# Patient Record
Sex: Female | Born: 1980 | Race: Black or African American | Hispanic: No | Marital: Single | Smoking: Never smoker
Health system: Southern US, Community
[De-identification: ages and names within clinical notes are randomized; demographics above are authoritative.]

## PROBLEM LIST (undated history)

## (undated) DIAGNOSIS — C73 Malignant neoplasm of thyroid gland: Secondary | ICD-10-CM

## (undated) DIAGNOSIS — I10 Essential (primary) hypertension: Secondary | ICD-10-CM

## (undated) DIAGNOSIS — I499 Cardiac arrhythmia, unspecified: Secondary | ICD-10-CM

## (undated) DIAGNOSIS — F419 Anxiety disorder, unspecified: Secondary | ICD-10-CM

## (undated) DIAGNOSIS — J45909 Unspecified asthma, uncomplicated: Secondary | ICD-10-CM

## (undated) DIAGNOSIS — I447 Left bundle-branch block, unspecified: Secondary | ICD-10-CM

## (undated) HISTORY — PX: BREAST BIOPSY: SHX20

## (undated) HISTORY — PX: CHOLECYSTECTOMY: SHX55

---
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
## 2008-05-19 HISTORY — PX: CHOLECYSTECTOMY: SHX55

## 2014-02-16 ENCOUNTER — Inpatient Hospital Stay (HOSPITAL_COMMUNITY): Admission: AD | Admit: 2014-02-16 | Payer: Self-pay | Source: Ambulatory Visit | Admitting: Obstetrics & Gynecology

## 2015-09-03 DIAGNOSIS — Z136 Encounter for screening for cardiovascular disorders: Secondary | ICD-10-CM | POA: Diagnosis not present

## 2015-09-03 DIAGNOSIS — Z0000 Encounter for general adult medical examination without abnormal findings: Secondary | ICD-10-CM | POA: Diagnosis not present

## 2015-09-05 DIAGNOSIS — Z0000 Encounter for general adult medical examination without abnormal findings: Secondary | ICD-10-CM | POA: Diagnosis not present

## 2015-09-05 DIAGNOSIS — Z23 Encounter for immunization: Secondary | ICD-10-CM | POA: Diagnosis not present

## 2015-09-05 DIAGNOSIS — Z6824 Body mass index (BMI) 24.0-24.9, adult: Secondary | ICD-10-CM | POA: Diagnosis not present

## 2015-09-13 DIAGNOSIS — H5213 Myopia, bilateral: Secondary | ICD-10-CM | POA: Diagnosis not present

## 2015-09-13 DIAGNOSIS — H52222 Regular astigmatism, left eye: Secondary | ICD-10-CM | POA: Diagnosis not present

## 2015-11-05 DIAGNOSIS — Z23 Encounter for immunization: Secondary | ICD-10-CM | POA: Diagnosis not present

## 2016-02-27 DIAGNOSIS — K5900 Constipation, unspecified: Secondary | ICD-10-CM | POA: Diagnosis not present

## 2016-02-27 DIAGNOSIS — N390 Urinary tract infection, site not specified: Secondary | ICD-10-CM | POA: Diagnosis not present

## 2016-03-11 DIAGNOSIS — G44209 Tension-type headache, unspecified, not intractable: Secondary | ICD-10-CM | POA: Diagnosis not present

## 2016-03-11 DIAGNOSIS — R030 Elevated blood-pressure reading, without diagnosis of hypertension: Secondary | ICD-10-CM | POA: Diagnosis not present

## 2016-03-11 DIAGNOSIS — L989 Disorder of the skin and subcutaneous tissue, unspecified: Secondary | ICD-10-CM | POA: Diagnosis not present

## 2016-03-14 DIAGNOSIS — Z23 Encounter for immunization: Secondary | ICD-10-CM | POA: Diagnosis not present

## 2016-03-14 DIAGNOSIS — R51 Headache: Secondary | ICD-10-CM | POA: Diagnosis not present

## 2016-03-14 DIAGNOSIS — K5900 Constipation, unspecified: Secondary | ICD-10-CM | POA: Diagnosis not present

## 2016-03-14 DIAGNOSIS — Z6824 Body mass index (BMI) 24.0-24.9, adult: Secondary | ICD-10-CM | POA: Diagnosis not present

## 2016-03-14 DIAGNOSIS — I10 Essential (primary) hypertension: Secondary | ICD-10-CM | POA: Diagnosis not present

## 2016-05-06 DIAGNOSIS — L723 Sebaceous cyst: Secondary | ICD-10-CM | POA: Diagnosis not present

## 2016-05-06 DIAGNOSIS — D239 Other benign neoplasm of skin, unspecified: Secondary | ICD-10-CM | POA: Diagnosis not present

## 2016-05-15 DIAGNOSIS — F411 Generalized anxiety disorder: Secondary | ICD-10-CM | POA: Diagnosis not present

## 2016-05-15 DIAGNOSIS — N760 Acute vaginitis: Secondary | ICD-10-CM | POA: Diagnosis not present

## 2016-05-15 DIAGNOSIS — I10 Essential (primary) hypertension: Secondary | ICD-10-CM | POA: Diagnosis not present

## 2016-05-15 DIAGNOSIS — Z6823 Body mass index (BMI) 23.0-23.9, adult: Secondary | ICD-10-CM | POA: Diagnosis not present

## 2016-07-22 ENCOUNTER — Emergency Department (HOSPITAL_COMMUNITY)
Admission: EM | Admit: 2016-07-22 | Discharge: 2016-07-22 | Disposition: A | Discharge status: Home / Self Care | Payer: BLUE CROSS/BLUE SHIELD | Attending: Emergency Medicine | Admitting: Emergency Medicine

## 2016-07-22 ENCOUNTER — Emergency Department (HOSPITAL_COMMUNITY): Payer: BLUE CROSS/BLUE SHIELD

## 2016-07-22 ENCOUNTER — Encounter (HOSPITAL_COMMUNITY): Payer: Self-pay

## 2016-07-22 DIAGNOSIS — I10 Essential (primary) hypertension: Secondary | ICD-10-CM | POA: Insufficient documentation

## 2016-07-22 DIAGNOSIS — I447 Left bundle-branch block, unspecified: Secondary | ICD-10-CM | POA: Insufficient documentation

## 2016-07-22 DIAGNOSIS — Z79899 Other long term (current) drug therapy: Secondary | ICD-10-CM | POA: Diagnosis not present

## 2016-07-22 DIAGNOSIS — R079 Chest pain, unspecified: Secondary | ICD-10-CM | POA: Diagnosis not present

## 2016-07-22 DIAGNOSIS — M546 Pain in thoracic spine: Secondary | ICD-10-CM | POA: Diagnosis not present

## 2016-07-22 HISTORY — DX: Essential (primary) hypertension: I10

## 2016-07-22 LAB — BASIC METABOLIC PANEL
Anion gap: 10 (ref 5–15)
BUN: 9 mg/dL (ref 6–20)
CHLORIDE: 99 mmol/L — AB (ref 101–111)
CO2: 25 mmol/L (ref 22–32)
CREATININE: 0.72 mg/dL (ref 0.44–1.00)
Calcium: 9.6 mg/dL (ref 8.9–10.3)
GFR calc Af Amer: >60 mL/min (ref >60)
GFR calc non Af Amer: >60 mL/min (ref >60)
Glucose, Bld: 125 mg/dL — ABNORMAL HIGH (ref 65–99)
Potassium: 3.6 mmol/L (ref 3.5–5.1)
SODIUM: 134 mmol/L — AB (ref 135–145)

## 2016-07-22 LAB — CBC
HCT: 39.3 % (ref 36.0–46.0)
Hemoglobin: 12.4 g/dL (ref 12.0–15.0)
MCH: 25.8 pg — AB (ref 26.0–34.0)
MCHC: 31.6 g/dL (ref 30.0–36.0)
MCV: 81.7 fL (ref 78.0–100.0)
PLATELETS: 274 10*3/uL (ref 150–400)
RBC: 4.81 MIL/uL (ref 3.87–5.11)
RDW: 14.2 % (ref 11.5–15.5)
WBC: 7.8 10*3/uL (ref 4.0–10.5)

## 2016-07-22 LAB — I-STAT TROPONIN, ED: Troponin i, poc: 0 ng/mL (ref 0.00–0.08)

## 2016-07-22 MED ORDER — METHOCARBAMOL 500 MG PO TABS: 500.0000 mg | ORAL_TABLET | Freq: Two times a day (BID) | ORAL | 0 refills | Status: DC

## 2016-07-22 NOTE — ED Triage Notes (Signed)
Per Pt, Pt started to have upper back pain that started this morning. Reports that she went to urgent care. They did an EKG and the patient reports it to be abnormal. Describes the pain as intermittent burning sensation.

## 2016-07-22 NOTE — ED Provider Notes (Signed)
[facility] DEPT Provider Note   CSN: [id number] Arrival date & time: 07/22/16  [id number]     History   Chief Complaint Chief Complaint  Patient presents with  . Back Pain    HPI Mindy [name] is a 36 y.o. female.  HPI Mindy [name] is a 36 y.o. female with history of hypertension, presents to emergency department complaining of upper back pain. Patient states her pain started this morning while at work. She denies any pain radiation. Denies any chest pain or shortness of breath. Denies any injuries. States pain was worse with movement, and now completely resolved. She did not take any medications for this. She does exercises vigorously, and lifts heavy objects at work, and states she thought she pulled a muscle. She went to urgent care, there they did an EKG which was found to be of normal and she was sent here for further evaluation. Patient is currently completely asymptomatic. She reports history of abnormal EKG which showed left bundle branch block 2 years ago after evaluation for shortness of breath postpartum. She states at that time she was evaluated in [place] by the cardiology group, had stress test and echocardiogram which both came back normal. She has not seen a cardiologist since then. She denies any pleuritic chest pain, no swelling in extremities, no recent travel, no surgeries, denies being pregnant.  Past Medical History:  Diagnosis Date  . Hypertension     There are no active problems to display for this patient.   Past Surgical History:  Procedure Laterality Date  . BREAST BIOPSY    . CHOLECYSTECTOMY      OB History    No data available       Home Medications    Prior to Admission medications   Not on File    Family History No family history on file.  Social History Social History  Substance Use Topics  . Smoking status: Never Smoker  . Smokeless tobacco: Never Used  . Alcohol use No     Allergies   Patient has no known  allergies.   Review of Systems Review of Systems  Constitutional: Negative for chills and fever.  Respiratory: Negative for cough, chest tightness and shortness of breath.   Cardiovascular: Negative for chest pain, palpitations and leg swelling.  Gastrointestinal: Negative for abdominal pain, diarrhea, nausea and vomiting.  Genitourinary: Negative for dysuria, flank pain and pelvic pain.  Musculoskeletal: Positive for back pain. Negative for arthralgias, myalgias, neck pain and neck stiffness.  Skin: Negative for rash.  Neurological: Negative for dizziness, weakness, numbness and headaches.  All other systems reviewed and are negative.    Physical Exam Updated Vital Signs BP 136/100   Pulse 78   Temp 98.5 F (36.9 C) (Oral)   Resp 16   Ht 5\' 7"  (1.702 m)   Wt 68 kg   LMP 07/15/2016   SpO2 100%   BMI 23.49 kg/m   Physical Exam  Constitutional: She is oriented to person, place, and time. She appears well-developed and well-nourished. No distress.  HENT:  Head: Normocephalic.  Eyes: Conjunctivae are normal.  Neck: Neck supple.  Cardiovascular: Normal rate, regular rhythm and normal heart sounds.   Pulmonary/Chest: Effort normal and breath sounds normal. No respiratory distress. She has no wheezes. She has no rales.  Abdominal: Soft. Bowel sounds are normal. She exhibits no distension. There is no tenderness. There is no rebound.  Musculoskeletal: She exhibits no edema.  No midline thoracic spine tenderness, no perivertebral tenderenss. Full  ROM of all extremities.   Neurological: She is alert and oriented to person, place, and time.  Skin: Skin is warm and dry.  Psychiatric: She has a normal mood and affect. Her behavior is normal.  Nursing note and vitals reviewed.    ED Treatments / Results  Labs (all labs ordered are listed, but only abnormal results are displayed) Labs Reviewed  BASIC METABOLIC PANEL - Abnormal; Notable for the following:       Result Value    Sodium 134 (*)    Chloride 99 (*)    Glucose, Bld 125 (*)    All other components within normal limits  CBC - Abnormal; Notable for the following:    MCH 25.8 (*)    All other components within normal limits  I-STAT TROPOININ, ED    EKG  EKG Interpretation  Date/Time:  Tuesday July 22 2016 18:59:54 EST Ventricular Rate:  99 PR Interval:  176 QRS Duration: 140 QT Interval:  366 QTC Calculation: 469 R Axis:   50 Text Interpretation:  Normal sinus rhythm Biatrial enlargement Left bundle branch block Abnormal ECG Confirmed by Alvino Chapel  MD, Ovid Curd [phone]) on 07/22/2016 8:48:56 PM       Radiology Dg Chest 2 View  Result Date: 07/22/2016 CLINICAL DATA:  Back pain, chest pain, EKG changes EXAM: CHEST  2 VIEW COMPARISON:  None. FINDINGS: The heart size and mediastinal contours are within normal limits. Both lungs are clear. The visualized skeletal structures are unremarkable. IMPRESSION: No active cardiopulmonary disease. Electronically Signed   By: Lahoma Crocker M.D.   On: 07/22/2016 19:53    Procedures Procedures (including critical care time)  Medications Ordered in ED Medications - No data to display   Initial Impression / Assessment and Plan / ED Course  I have reviewed the triage vital signs and the nursing notes.  Pertinent labs & imaging results that were available during my care of the patient were reviewed by me and considered in my medical decision making (see chart for details).     Patient with some upper back pain earlier which is now resolved, sent here for abnormal ECG. Patient's EKG showing left bundle branch block. Patient has history of the same, although we do not have records, patient is confident that she has history of left bundle branch block and had negative stress test and echo 2 years ago. Today her troponin is negative. Chest x-ray is negative. She has no other risk factors for cardiac disease. She is PERC negative.  We'll discharge her home. Patient  requested cardiology referral so she can follow-up here. We'll give [facility] phone for [facility]. Return precautions discussed.   Vitals:   07/22/16 1858 07/22/16 2115 07/22/16 2130 07/22/16 2145  BP:  140/89 144/92 136/100  Pulse:  89 75 78  Resp:  15 13 16   Temp:      TempSrc:      SpO2:  100% 100% 100%  Weight: 68 kg     Height: 5\' 7"  (1.702 m)        Final Clinical Impressions(s) / ED Diagnoses   Final diagnoses:  Left bundle branch block    New Prescriptions New Prescriptions   No medications on file     Jeannett Senior, PA-C 07/23/16 [place], MD 07/28/16 [phone]

## 2016-07-22 NOTE — Discharge Instructions (Signed)
Take ibuprofen or aleve for pain as needed. Robaxin for muscle spasms. Please follow up with a cardiologist regarding your ECG. Return if any concerning symptoms.

## 2016-09-03 DIAGNOSIS — Z136 Encounter for screening for cardiovascular disorders: Secondary | ICD-10-CM | POA: Diagnosis not present

## 2016-09-03 DIAGNOSIS — Z0000 Encounter for general adult medical examination without abnormal findings: Secondary | ICD-10-CM | POA: Diagnosis not present

## 2016-09-05 DIAGNOSIS — Z6824 Body mass index (BMI) 24.0-24.9, adult: Secondary | ICD-10-CM | POA: Diagnosis not present

## 2016-09-05 DIAGNOSIS — Z0000 Encounter for general adult medical examination without abnormal findings: Secondary | ICD-10-CM | POA: Diagnosis not present

## 2016-11-01 DIAGNOSIS — H5213 Myopia, bilateral: Secondary | ICD-10-CM | POA: Diagnosis not present

## 2017-03-27 DIAGNOSIS — N390 Urinary tract infection, site not specified: Secondary | ICD-10-CM | POA: Diagnosis not present

## 2017-03-27 DIAGNOSIS — F411 Generalized anxiety disorder: Secondary | ICD-10-CM | POA: Diagnosis not present

## 2017-03-27 DIAGNOSIS — Z6824 Body mass index (BMI) 24.0-24.9, adult: Secondary | ICD-10-CM | POA: Diagnosis not present

## 2017-03-27 DIAGNOSIS — I10 Essential (primary) hypertension: Secondary | ICD-10-CM | POA: Diagnosis not present

## 2017-09-09 DIAGNOSIS — Z0000 Encounter for general adult medical examination without abnormal findings: Secondary | ICD-10-CM | POA: Diagnosis not present

## 2017-09-09 DIAGNOSIS — Z1329 Encounter for screening for other suspected endocrine disorder: Secondary | ICD-10-CM | POA: Diagnosis not present

## 2017-09-09 DIAGNOSIS — Z114 Encounter for screening for human immunodeficiency virus [HIV]: Secondary | ICD-10-CM | POA: Diagnosis not present

## 2017-09-09 DIAGNOSIS — Z13220 Encounter for screening for lipoid disorders: Secondary | ICD-10-CM | POA: Diagnosis not present

## 2017-09-11 DIAGNOSIS — Z6824 Body mass index (BMI) 24.0-24.9, adult: Secondary | ICD-10-CM | POA: Diagnosis not present

## 2017-09-11 DIAGNOSIS — N3090 Cystitis, unspecified without hematuria: Secondary | ICD-10-CM | POA: Diagnosis not present

## 2017-09-11 DIAGNOSIS — Z0000 Encounter for general adult medical examination without abnormal findings: Secondary | ICD-10-CM | POA: Diagnosis not present

## 2017-09-11 DIAGNOSIS — N390 Urinary tract infection, site not specified: Secondary | ICD-10-CM | POA: Diagnosis not present

## 2017-10-13 DIAGNOSIS — H9313 Tinnitus, bilateral: Secondary | ICD-10-CM | POA: Diagnosis not present

## 2017-10-13 DIAGNOSIS — I10 Essential (primary) hypertension: Secondary | ICD-10-CM | POA: Diagnosis not present

## 2017-10-13 DIAGNOSIS — Z6825 Body mass index (BMI) 25.0-25.9, adult: Secondary | ICD-10-CM | POA: Diagnosis not present

## 2017-10-13 DIAGNOSIS — H6123 Impacted cerumen, bilateral: Secondary | ICD-10-CM | POA: Diagnosis not present

## 2017-11-02 DIAGNOSIS — Z0110 Encounter for examination of ears and hearing without abnormal findings: Secondary | ICD-10-CM | POA: Diagnosis not present

## 2017-11-02 DIAGNOSIS — H9311 Tinnitus, right ear: Secondary | ICD-10-CM | POA: Diagnosis not present

## 2018-05-20 DIAGNOSIS — I10 Essential (primary) hypertension: Secondary | ICD-10-CM | POA: Diagnosis not present

## 2018-05-20 DIAGNOSIS — Z6823 Body mass index (BMI) 23.0-23.9, adult: Secondary | ICD-10-CM | POA: Diagnosis not present

## 2018-06-21 DIAGNOSIS — M542 Cervicalgia: Secondary | ICD-10-CM | POA: Diagnosis not present

## 2018-06-21 DIAGNOSIS — R252 Cramp and spasm: Secondary | ICD-10-CM | POA: Diagnosis not present

## 2018-06-21 DIAGNOSIS — M549 Dorsalgia, unspecified: Secondary | ICD-10-CM | POA: Diagnosis not present

## 2018-06-21 DIAGNOSIS — Z6824 Body mass index (BMI) 24.0-24.9, adult: Secondary | ICD-10-CM | POA: Diagnosis not present

## 2018-07-05 DIAGNOSIS — M542 Cervicalgia: Secondary | ICD-10-CM | POA: Diagnosis not present

## 2018-07-05 DIAGNOSIS — Z6824 Body mass index (BMI) 24.0-24.9, adult: Secondary | ICD-10-CM | POA: Diagnosis not present

## 2018-07-05 DIAGNOSIS — M549 Dorsalgia, unspecified: Secondary | ICD-10-CM | POA: Diagnosis not present

## 2018-07-05 DIAGNOSIS — I10 Essential (primary) hypertension: Secondary | ICD-10-CM | POA: Diagnosis not present

## 2018-11-24 DIAGNOSIS — Z135 Encounter for screening for eye and ear disorders: Secondary | ICD-10-CM | POA: Diagnosis not present

## 2018-11-24 DIAGNOSIS — H5210 Myopia, unspecified eye: Secondary | ICD-10-CM | POA: Diagnosis not present

## 2018-12-17 DIAGNOSIS — H938X3 Other specified disorders of ear, bilateral: Secondary | ICD-10-CM | POA: Diagnosis not present

## 2018-12-20 DIAGNOSIS — Z0000 Encounter for general adult medical examination without abnormal findings: Secondary | ICD-10-CM | POA: Diagnosis not present

## 2019-01-25 DIAGNOSIS — Z0000 Encounter for general adult medical examination without abnormal findings: Secondary | ICD-10-CM | POA: Diagnosis not present

## 2019-01-25 DIAGNOSIS — Z124 Encounter for screening for malignant neoplasm of cervix: Secondary | ICD-10-CM | POA: Diagnosis not present

## 2019-01-25 DIAGNOSIS — Z6825 Body mass index (BMI) 25.0-25.9, adult: Secondary | ICD-10-CM | POA: Diagnosis not present

## 2019-10-31 ENCOUNTER — Emergency Department (HOSPITAL_COMMUNITY): Payer: 59

## 2019-10-31 ENCOUNTER — Encounter (HOSPITAL_COMMUNITY): Payer: Self-pay | Admitting: Emergency Medicine

## 2019-10-31 ENCOUNTER — Emergency Department (HOSPITAL_COMMUNITY)
Admission: EM | Admit: 2019-10-31 | Discharge: 2019-10-31 | Disposition: A | Discharge status: Home / Self Care | Payer: 59 | Attending: Emergency Medicine | Admitting: Emergency Medicine

## 2019-10-31 ENCOUNTER — Other Ambulatory Visit: Payer: Self-pay

## 2019-10-31 DIAGNOSIS — I10 Essential (primary) hypertension: Secondary | ICD-10-CM | POA: Insufficient documentation

## 2019-10-31 DIAGNOSIS — R079 Chest pain, unspecified: Secondary | ICD-10-CM

## 2019-10-31 DIAGNOSIS — R0789 Other chest pain: Secondary | ICD-10-CM | POA: Insufficient documentation

## 2019-10-31 LAB — CBC
HCT: 40.9 % (ref 36.0–46.0)
Hemoglobin: 12.9 g/dL (ref 12.0–15.0)
MCH: 27.2 pg (ref 26.0–34.0)
MCHC: 31.5 g/dL (ref 30.0–36.0)
MCV: 86.3 fL (ref 80.0–100.0)
Platelets: 244 10*3/uL (ref 150–400)
RBC: 4.74 MIL/uL (ref 3.87–5.11)
RDW: 12.8 % (ref 11.5–15.5)
WBC: 6.2 10*3/uL (ref 4.0–10.5)
nRBC: 0 % (ref 0.0–0.2)

## 2019-10-31 LAB — BASIC METABOLIC PANEL
Anion gap: 9 (ref 5–15)
BUN: 8 mg/dL (ref 6–20)
CO2: 26 mmol/L (ref 22–32)
Calcium: 9.6 mg/dL (ref 8.9–10.3)
Chloride: 103 mmol/L (ref 98–111)
Creatinine, Ser: 0.62 mg/dL (ref 0.44–1.00)
GFR calc Af Amer: >60 mL/min (ref >60)
GFR calc non Af Amer: >60 mL/min (ref >60)
Glucose, Bld: 105 mg/dL — ABNORMAL HIGH (ref 70–99)
Potassium: 3.2 mmol/L — ABNORMAL LOW (ref 3.5–5.1)
Sodium: 138 mmol/L (ref 135–145)

## 2019-10-31 LAB — TROPONIN I (HIGH SENSITIVITY)
Troponin I (High Sensitivity): <2 ng/L (ref <18)
Troponin I (High Sensitivity): <2 ng/L (ref <18)

## 2019-10-31 LAB — I-STAT BETA HCG BLOOD, ED (MC, WL, AP ONLY): I-stat hCG, quantitative: <5 m[IU]/mL (ref <5)

## 2019-10-31 MED ORDER — SODIUM CHLORIDE 0.9% FLUSH: 3.0000 mL | Freq: Once | INTRAVENOUS | Status: DC

## 2019-10-31 NOTE — ED Provider Notes (Signed)
[facility] EMERGENCY DEPARTMENT Provider Note   CSN: [id number] Arrival date & time: 10/31/19  0703     History Chief Complaint  Patient presents with  . Chest Pain    Mindy [name] is a 39 y.o. female.  39 year old female presents with intermittent chest discomfort characterizes pressure was last for seconds to minutes.  States that she has been under a lot of stress and has had some anxiety recently.  States the pain comes and goes and nothing makes it better or worse.  No associated dyspnea or diaphoresis.  No prior history of same.  Denies any fever or cough.  No leg pain or swelling.  Symptoms resolved spontaneously.  No treatment use prior to arrival        Past Medical History:  Diagnosis Date  . Hypertension     There are no problems to display for this patient.   Past Surgical History:  Procedure Laterality Date  . BREAST BIOPSY    . CHOLECYSTECTOMY       OB History   No obstetric history on file.     No family history on file.  Social History   Tobacco Use  . Smoking status: Never Smoker  . Smokeless tobacco: Never Used  Substance Use Topics  . Alcohol use: No  . Drug use: No    Home Medications Prior to Admission medications   Medication Sig Start Date End Date Taking? Authorizing Provider  methocarbamol (ROBAXIN) 500 MG tablet Take 1 tablet (500 mg total) by mouth 2 (two) times daily. 07/22/16   Jeannett Senior, PA-C    Allergies    Patient has no known allergies.  Review of Systems   Review of Systems  All other systems reviewed and are negative.   Physical Exam Updated Vital Signs BP (!) 148/96 (BP Location: Right Arm)   Pulse (!) 122   Temp 98.3 F (36.8 C) (Oral)   Resp 20   Ht 1.702 m (5\' 7" )   Wt 65.8 kg   LMP 10/10/2019   SpO2 100%   BMI 22.71 kg/m   Physical Exam Vitals and nursing note reviewed.  Constitutional:      General: She is not in acute distress.    Appearance: Normal  appearance. She is well-developed. She is not toxic-appearing.  HENT:     Head: Normocephalic and atraumatic.  Eyes:     General: Lids are normal.     Conjunctiva/sclera: Conjunctivae normal.     Pupils: Pupils are equal, round, and reactive to light.  Neck:     Thyroid: No thyroid mass.     Trachea: No tracheal deviation.  Cardiovascular:     Rate and Rhythm: Normal rate and regular rhythm.     Heart sounds: Normal heart sounds. No murmur heard.  No gallop.   Pulmonary:     Effort: Pulmonary effort is normal. No respiratory distress.     Breath sounds: Normal breath sounds. No stridor. No decreased breath sounds, wheezing, rhonchi or rales.  Abdominal:     General: Bowel sounds are normal. There is no distension.     Palpations: Abdomen is soft.     Tenderness: There is no abdominal tenderness. There is no rebound.  Musculoskeletal:        General: No tenderness. Normal range of motion.     Cervical back: Normal range of motion and neck supple.  Skin:    General: Skin is warm and dry.     Findings: No  abrasion or rash.  Neurological:     Mental Status: She is alert and oriented to person, place, and time.     GCS: GCS eye subscore is 4. GCS verbal subscore is 5. GCS motor subscore is 6.     Cranial Nerves: No cranial nerve deficit.     Sensory: No sensory deficit.  Psychiatric:        Attention and Perception: Attention normal.        Speech: Speech normal.        Behavior: Behavior normal.     ED Results / Procedures / Treatments   Labs (all labs ordered are listed, but only abnormal results are displayed) Labs Reviewed  BASIC METABOLIC PANEL - Abnormal; Notable for the following components:      Result Value   Potassium 3.2 (*)    Glucose, Bld 105 (*)    All other components within normal limits  CBC  I-STAT BETA HCG BLOOD, ED ([facility], [facility], [facility] ONLY)  TROPONIN I (HIGH SENSITIVITY)  TROPONIN I (HIGH SENSITIVITY)    EKG EKG Interpretation  Date/Time:  Monday October 31 2019 07:03:55 EDT Ventricular Rate:  96 PR Interval:  190 QRS Duration: 130 QT Interval:  372 QTC Calculation: 469 R Axis:   83 Text Interpretation: Normal sinus rhythm Right atrial enlargement Left ventricular hypertrophy with QRS widening and repolarization abnormality ( Cornell product , Romhilt-Estes ) Cannot rule out Anteroseptal infarct , age undetermined Abnormal ECG No significant change since last tracing Confirmed by Lacretia Leigh ([id number]) on 10/31/2019 9:25:36 AM   Radiology DG Chest 2 View  Result Date: 10/31/2019 CLINICAL DATA:  Left-sided chest pain.  Cold sweats. EXAM: CHEST - 2 VIEW COMPARISON:  07/22/2016 FINDINGS: Normal heart size and mediastinal contours. Artifact from EKG leads. No acute infiltrate or edema. No effusion or pneumothorax. No acute osseous findings. Presumed cholecystectomy clips. IMPRESSION: Negative chest. Electronically Signed   By: Monte Fantasia M.D.   On: 10/31/2019 07:39    Procedures Procedures (including critical care time)  Medications Ordered in ED Medications  sodium chloride flush (NS) 0.9 % injection 3 mL (has no administration in time range)    ED Course  I have reviewed the triage vital signs and the nursing notes.  Pertinent labs & imaging results that were available during my care of the patient were reviewed by me and considered in my medical decision making (see chart for details).    MDM Rules/Calculators/A&P                         Patient with atypical chest pain here.  Dr. Troponin is normal.  Heart score is 1.  No further evaluation needed and suspect anxiety has some component of her symptoms.  Patient encouraged to follow-up with her doctor Final Clinical Impression(s) / ED Diagnoses Final diagnoses:  None    Rx / DC Orders ED Discharge Orders    None       Lacretia Leigh, MD 10/31/19 1150

## 2019-10-31 NOTE — ED Triage Notes (Signed)
Pt reports she woke up with L sided chest pain, nausea and cold sweats over night, states she was able to get back to sleep, upon waking this morning, pain had returned, denies sob. Reports hx of LBBB. Resp e/u, nad.

## 2019-10-31 NOTE — ED Notes (Signed)
Pt ambulated to br with a steady gait.

## 2020-01-27 ENCOUNTER — Other Ambulatory Visit: Payer: Self-pay | Admitting: Family Medicine

## 2020-01-27 DIAGNOSIS — E041 Nontoxic single thyroid nodule: Secondary | ICD-10-CM

## 2020-02-01 ENCOUNTER — Ambulatory Visit
Admission: RE | Admit: 2020-02-01 | Discharge: 2020-02-01 | Disposition: A | Discharge status: Home / Self Care | Payer: 59 | Source: Ambulatory Visit | Attending: Family Medicine | Admitting: Family Medicine

## 2020-02-01 DIAGNOSIS — E041 Nontoxic single thyroid nodule: Secondary | ICD-10-CM

## 2020-02-02 ENCOUNTER — Other Ambulatory Visit: Payer: Self-pay | Admitting: Family Medicine

## 2020-02-02 DIAGNOSIS — E041 Nontoxic single thyroid nodule: Secondary | ICD-10-CM

## 2020-02-09 ENCOUNTER — Ambulatory Visit
Admission: RE | Admit: 2020-02-09 | Discharge: 2020-02-09 | Disposition: A | Discharge status: Home / Self Care | Payer: 59 | Source: Ambulatory Visit | Attending: Family Medicine | Admitting: Family Medicine

## 2020-02-09 ENCOUNTER — Other Ambulatory Visit (HOSPITAL_COMMUNITY)
Admission: RE | Admit: 2020-02-09 | Discharge: 2020-02-09 | Disposition: A | Discharge status: Home / Self Care | Payer: 59 | Source: Ambulatory Visit | Attending: Student | Admitting: Student

## 2020-02-09 DIAGNOSIS — E041 Nontoxic single thyroid nodule: Secondary | ICD-10-CM | POA: Diagnosis present

## 2020-02-09 DIAGNOSIS — D440 Neoplasm of uncertain behavior of thyroid gland: Secondary | ICD-10-CM | POA: Insufficient documentation

## 2020-02-10 LAB — CYTOLOGY - NON PAP

## 2020-03-01 ENCOUNTER — Ambulatory Visit: Payer: Self-pay | Admitting: Surgery

## 2020-03-06 ENCOUNTER — Encounter (HOSPITAL_COMMUNITY): Payer: Self-pay

## 2020-03-06 NOTE — Progress Notes (Addendum)
COVID Vaccine Completed:  x2 Date COVID Vaccine completed:  August 2021 2nd dose COVID vaccine manufacturer: [employer]   PCP - Rachell Cipro, MD Cardiologist -   Chest x-ray - 03-07-20 EKG - 10-31-19 in Epic Stress Test - 4+ years at [facility] Cardiology ECHO - 4+ years at [facility] Cardiology  Cardiac Cath -  Pacemaker/ICD device last checked:  Sleep Study -  CPAP -   Fasting Blood Sugar -  Checks Blood Sugar _____ times a day  Blood Thinner Instructions: Aspirin Instructions: Last Dose:  Anesthesia review:  Evaluation for chest pain 10-31-19 (attributed to anxiety).  Hx of abnormal EKG &  LBBB  Patient denies shortness of breath, fever, cough and chest pain at PAT appointment   Patient verbalized understanding of instructions that were given to them at the PAT appointment. Patient was also instructed that they will need to review over the PAT instructions again at home before surgery.

## 2020-03-06 NOTE — Patient Instructions (Addendum)
DUE TO COVID-19 ONLY ONE VISITOR IS ALLOWED TO COME WITH YOU AND STAY IN THE WAITING ROOM ONLY DURING PRE OP AND  PROCEDURE.   IF YOU WILL BE ADMITTED INTO THE HOSPITAL YOU ARE ALLOWED ONE SUPPORT PERSON DURING VISITATION HOURS ONLY (10AM -8PM)   . The support person may change daily. . The support person must pass our screening, gel in and out, and wear a mask at all times, including in the patient's room. . Patients must also wear a mask when staff or their support person are in the room.   COVID SWAB TESTING MUST BE COMPLETED ON:  Thursday, 03-15-20 @ 9:00 AM   [address] [place], [place] [address]  (Must self quarantine after testing. Follow instructions on handout.)        Your procedure is scheduled on:  Monday, 03-19-20   Report to [facility] Main  Entrance   Report to Short Stay at 5:30 AM   [facility])    Call this number if you have problems the morning of surgery [phone]   Do not eat food :After Midnight.   May have liquids until 4:30 AM day of surgery  CLEAR LIQUID DIET  Foods Allowed                                                                     Foods Excluded  Water, Black Coffee and tea, regular and decaf               liquids that you cannot  Plain Jell-O in any flavor  (No red)                                      see through such as: Fruit ices (not with fruit pulp)                                      milk, soups, orange juice              Iced Popsicles (No red)                                      All solid food                                   Apple juices Sports drinks like Gatorade (No red) Lightly seasoned clear broth or consume(fat free) Sugar, honey syrup  Oral Hygiene is also important to reduce your risk of infection.                                    Remember - BRUSH YOUR TEETH THE MORNING OF SURGERY WITH YOUR REGULAR TOOTHPASTE   Do NOT smoke after Midnight   Take these medicines the morning of surgery with A SIP OF  WATER:  None  You may not have any metal on your body including hair pins, jewelry, and body piercings             Do not wear make-up, lotions, powders, perfumes/cologne, or deodorant             Do not wear nail polish.  Do not shave  48 hours prior to surgery.        Do not bring valuables to the hospital. [place].   Contacts, dentures or bridgework may not be worn into surgery.   Bring small overnight bag day of surgery.                  Please read over the following fact sheets you were given: IF YOU HAVE QUESTIONS ABOUT YOUR PRE OP INSTRUCTIONS PLEASE CALL [phone]   [facility] - Preparing for Surgery Before surgery, you can play an important role.  Because skin is not sterile, your skin needs to be as free of germs as possible.  You can reduce the number of germs on your skin by washing with CHG (chlorahexidine gluconate) soap before surgery.  CHG is an antiseptic cleaner which kills germs and bonds with the skin to continue killing germs even after washing. Please DO NOT use if you have an allergy to CHG or antibacterial soaps.  If your skin becomes reddened/irritated stop using the CHG and inform your nurse when you arrive at Short Stay. Do not shave (including legs and underarms) for at least 48 hours prior to the first CHG shower.  You may shave your face/neck.  Please follow these instructions carefully:  1.  Shower with CHG Soap the night before surgery and the  morning of surgery.  2.  If you choose to wash your hair, wash your hair first as usual with your normal  shampoo.  3.  After you shampoo, rinse your hair and body thoroughly to remove the shampoo.                             4.  Use CHG as you would any other liquid soap.  You can apply chg directly to the skin and wash.  Gently with a scrungie or clean washcloth.  5.  Apply the CHG Soap to your body ONLY FROM THE NECK DOWN.   Do   not use  on face/ open                           Wound or open sores. Avoid contact with eyes, ears mouth and   genitals (private parts).                       Wash face,  Genitals (private parts) with your normal soap.             6.  Wash thoroughly, paying special attention to the area where your    surgery  will be performed.  7.  Thoroughly rinse your body with warm water from the neck down.  8.  DO NOT shower/wash with your normal soap after using and rinsing off the CHG Soap.                9.  Pat yourself dry with a clean towel.            10.  Wear  clean pajamas.            11.  Place clean sheets on your bed the night of your first shower and do not  sleep with pets. Day of Surgery : Do not apply any lotions/deodorants the morning of surgery.  Please wear clean clothes to the hospital/surgery center.  FAILURE TO FOLLOW THESE INSTRUCTIONS MAY RESULT IN THE CANCELLATION OF YOUR SURGERY  PATIENT SIGNATURE_________________________________  NURSE SIGNATURE__________________________________  ________________________________________________________________________

## 2020-03-07 ENCOUNTER — Encounter (HOSPITAL_COMMUNITY): Payer: Self-pay

## 2020-03-07 ENCOUNTER — Ambulatory Visit (HOSPITAL_COMMUNITY)
Admission: RE | Admit: 2020-03-07 | Discharge: 2020-03-07 | Disposition: A | Discharge status: Home / Self Care | Payer: 59 | Source: Ambulatory Visit | Attending: Anesthesiology | Admitting: Anesthesiology

## 2020-03-07 ENCOUNTER — Encounter (HOSPITAL_COMMUNITY)
Admission: RE | Admit: 2020-03-07 | Discharge: 2020-03-07 | Disposition: A | Discharge status: Home / Self Care | Payer: 59 | Source: Ambulatory Visit | Attending: Surgery | Admitting: Surgery

## 2020-03-07 ENCOUNTER — Other Ambulatory Visit: Payer: Self-pay

## 2020-03-07 DIAGNOSIS — Z01818 Encounter for other preprocedural examination: Secondary | ICD-10-CM | POA: Insufficient documentation

## 2020-03-07 HISTORY — DX: Left bundle-branch block, unspecified: I44.7

## 2020-03-07 HISTORY — DX: Anxiety disorder, unspecified: F41.9

## 2020-03-07 HISTORY — DX: Malignant neoplasm of thyroid gland: C73

## 2020-03-07 LAB — BASIC METABOLIC PANEL
Anion gap: 9 (ref 5–15)
BUN: 9 mg/dL (ref 6–20)
CO2: 28 mmol/L (ref 22–32)
Calcium: 9.4 mg/dL (ref 8.9–10.3)
Chloride: 99 mmol/L (ref 98–111)
Creatinine, Ser: 0.61 mg/dL (ref 0.44–1.00)
GFR, Estimated: >60 mL/min (ref >60)
Glucose, Bld: 96 mg/dL (ref 70–99)
Potassium: 3.9 mmol/L (ref 3.5–5.1)
Sodium: 136 mmol/L (ref 135–145)

## 2020-03-07 LAB — CBC
HCT: 39.6 % (ref 36.0–46.0)
Hemoglobin: 12.5 g/dL (ref 12.0–15.0)
MCH: 27.2 pg (ref 26.0–34.0)
MCHC: 31.6 g/dL (ref 30.0–36.0)
MCV: 86.1 fL (ref 80.0–100.0)
Platelets: 228 10*3/uL (ref 150–400)
RBC: 4.6 MIL/uL (ref 3.87–5.11)
RDW: 12.8 % (ref 11.5–15.5)
WBC: 5.4 10*3/uL (ref 4.0–10.5)
nRBC: 0 % (ref 0.0–0.2)

## 2020-03-11 ENCOUNTER — Encounter (HOSPITAL_COMMUNITY): Payer: Self-pay | Admitting: Surgery

## 2020-03-11 DIAGNOSIS — C73 Malignant neoplasm of thyroid gland: Secondary | ICD-10-CM | POA: Diagnosis present

## 2020-03-11 NOTE — H&P (Signed)
[facility]  Mindy [name] DOB: 04/09/1981 Single / Language: Cleophus Molt / Race: Black or African American Female   History of Present Illness  The patient is a 39 year old female who presents with thyroid cancer.  CHIEF COMPLAINT: papillary thyroid carcinoma  Patient is referred by Dr. Rachell Cipro for surgical evaluation and management of newly diagnosed papillary thyroid carcinoma. Patient will see Dr. Dagmar Hait in consultation from endocrinology. Patient noted a new mass in the right anterior neck approximately one month ago on self-examination. She presented to her primary care physician and was referred for ultrasound. This study showed a mildly enlarged thyroid gland with a solitary dominant nodule in the right superior position measuring 3.4 cm with echogenic foci and peripheral calcifications. It was irregular. It was felt to be highly suspicious and fine-needle aspiration biopsy was recommended. Patient underwent biopsy on February 09, 2020. This demonstrated papillary thyroid carcinoma, Bethesda category VI. Patient was referred to surgery for thyroidectomy. Patient has no prior history of head or neck surgery. She has no prior history of thyroid disease. She has never been on thyroid medication. There are no immediate family members with thyroid disease. Patient has worked for an [employer] for 15 years. She has 2 daughters. She presents today to discuss management of her thyroid cancer.   Past Surgical History Breast Biopsy  Right. Gallbladder Surgery - Laparoscopic   Diagnostic Studies History  Colonoscopy  never Mammogram  never Pap Smear  1-5 years ago  Allergies  No Known Drug Allergies  Allergies Reconciled   Medication History  Lisinopril (5MG  Tablet, Oral) Active. Prenatal Tablets (Oral) Specific strength unknown - Active. Medications Reconciled  Pregnancy / Birth History  Age at menarche   89 years. Gravida  2 Length (months) of breastfeeding  3-6 Maternal age  85-30 Para  2 Regular periods   Other Problems  Anxiety Disorder  Asthma  Back Pain  Chest pain  Cholelithiasis  High blood pressure  Lump In Breast   Review of Systems  General Not Present- Appetite Loss, Chills, Fatigue, Fever, Night Sweats, Weight Gain and Weight Loss. Skin Not Present- Change in Wart/Mole, Dryness, Hives, Jaundice, New Lesions, Non-Healing Wounds, Rash and Ulcer. HEENT Present- Wears glasses/contact lenses. Not Present- Earache, Hearing Loss, Hoarseness, Nose Bleed, Oral Ulcers, Ringing in the Ears, Seasonal Allergies, Sinus Pain, Sore Throat, Visual Disturbances and Yellow Eyes. Respiratory Not Present- Bloody sputum, Chronic Cough, Difficulty Breathing, Snoring and Wheezing. Breast Not Present- Breast Mass, Breast Pain, Nipple Discharge and Skin Changes. Cardiovascular Not Present- Chest Pain, Difficulty Breathing Lying Down, Leg Cramps, Palpitations, Rapid Heart Rate, Shortness of Breath and Swelling of Extremities. Gastrointestinal Not Present- Abdominal Pain, Bloating, Bloody Stool, Change in Bowel Habits, Chronic diarrhea, Constipation, Difficulty Swallowing, Excessive gas, Gets full quickly at meals, Hemorrhoids, Indigestion, Nausea, Rectal Pain and Vomiting. Female Genitourinary Not Present- Frequency, Nocturia, Painful Urination, Pelvic Pain and Urgency. Musculoskeletal Not Present- Back Pain, Joint Pain, Joint Stiffness, Muscle Pain, Muscle Weakness and Swelling of Extremities. Neurological Not Present- Decreased Memory, Fainting, Headaches, Numbness, Seizures, Tingling, Tremor, Trouble walking and Weakness. Psychiatric Present- Anxiety. Not Present- Bipolar, Change in Sleep Pattern, Depression, Fearful and Frequent crying. Endocrine Not Present- Cold Intolerance, Excessive Hunger, Hair Changes, Heat Intolerance, Hot flashes and New Diabetes. Hematology Not Present- Blood  Thinners, Easy Bruising, Excessive bleeding, Gland problems, HIV and Persistent Infections.  Vitals  Weight: 143.13 lb Height: 67in Body Surface Area: 1.75 m Body Mass Index: 22.42 kg/m  Temp.: 97.66F  Pulse: 96 (Regular)  BP: 112/76(Sitting, Left Arm, Standard)  Physical Exam   GENERAL APPEARANCE Development: normal Nutritional status: normal Gross deformities: none  SKIN Rash, lesions, ulcers: none Induration, erythema: none Nodules: none palpable  EYES Conjunctiva and lids: normal Pupils: equal and reactive Iris: normal bilaterally  EARS, NOSE, MOUTH, THROAT External ears: no lesion or deformity External nose: no lesion or deformity Hearing: grossly normal Due to Covid-19 pandemic, patient is wearing a mask.  NECK Symmetric: no Trachea: midline Thyroid: Palpation of the left thyroid lobe shows no significant nodularity or masses. There is no tenderness. Isthmus is normal. Right thyroid lobe shows a firm discrete hard mass in the superior pole measuring approximately 3 cm in size. This is mobile. There is no associated lymphadenopathy. There is no tenderness.  CHEST Respiratory effort: normal Retraction or accessory muscle use: no Breath sounds: normal bilaterally Rales, rhonchi, wheeze: none  CARDIOVASCULAR Auscultation: regular rhythm, normal rate Murmurs: none Pulses: radial pulse 2+ palpable Lower extremity edema: none  MUSCULOSKELETAL Station and gait: normal Digits and nails: no clubbing or cyanosis Muscle strength: grossly normal all extremities Range of motion: grossly normal all extremities Deformity: none  LYMPHATIC Cervical: none palpable Supraclavicular: none palpable  PSYCHIATRIC Oriented to person, place, and time: yes Mood and affect: normal for situation Judgment and insight: appropriate for situation    Assessment & Plan   PAPILLARY THYROID CARCINOMA (C73)  Patient is referred by her primary care physician for  surgical evaluation and management of newly diagnosed papillary thyroid carcinoma.  Patient provided with a copy of "The Thyroid Book: Medical and Surgical Treatment of Thyroid Problems", published by [employer], 16 pages. Book reviewed and explained to patient during visit today.  Patient has a 3.5 cm papillary thyroid carcinoma in the right thyroid lobe. Based on the size and location of this tumor, I have recommended proceeding with total thyroidectomy as the procedure of choice for management. We will also perform a limited central compartment lymph node dissection in order to evaluate for lymphatic metastasis. The patient and I discussed the procedure. We discussed the risk and benefits including the risk of recurrent laryngeal nerve injury and injury to parathyroid glands. We discussed the size and location of the surgical incision. We discussed the hospital stay to be anticipated. We discussed her postoperative recovery. We discussed the potential need for radioactive iodine treatment. We discussed the need for lifelong thyroid hormone replacement. The patient understands and wishes to proceed with surgery in the near future.  The risks and benefits of the procedure have been discussed at length with the patient. The patient understands the proposed procedure, potential alternative treatments, and the course of recovery to be expected. All of the patient's questions have been answered at this time. The patient wishes to proceed with surgery.  Armandina Gemma, MD [facility] Office: [phone]

## 2020-03-15 ENCOUNTER — Other Ambulatory Visit (HOSPITAL_COMMUNITY)
Admission: RE | Admit: 2020-03-15 | Discharge: 2020-03-15 | Disposition: A | Discharge status: Home / Self Care | Payer: 59 | Source: Ambulatory Visit | Attending: Surgery | Admitting: Surgery

## 2020-03-15 DIAGNOSIS — Z01812 Encounter for preprocedural laboratory examination: Secondary | ICD-10-CM | POA: Diagnosis present

## 2020-03-15 DIAGNOSIS — Z20822 Contact with and (suspected) exposure to covid-19: Secondary | ICD-10-CM | POA: Insufficient documentation

## 2020-03-15 LAB — SARS CORONAVIRUS 2 (TAT 6-24 HRS): SARS Coronavirus 2: NEGATIVE

## 2020-03-16 NOTE — Anesthesia Preprocedure Evaluation (Addendum)
Anesthesia Evaluation  Patient identified by MRN, date of birth, ID band Patient awake    Reviewed: Allergy & Precautions, NPO status , Patient's Chart, lab work & pertinent test results  Airway Mallampati: I  TM Distance: >3 FB Neck ROM: Full    Dental no notable dental hx.    Pulmonary neg pulmonary ROS,    Pulmonary exam normal breath sounds clear to auscultation       Cardiovascular hypertension, Pt. on medications Normal cardiovascular exam Rhythm:Regular Rate:Normal  ECG: NSR, rate 96   Neuro/Psych Anxiety negative neurological ROS     GI/Hepatic negative GI ROS, Neg liver ROS,   Endo/Other  negative endocrine ROS  Renal/GU negative Renal ROS     Musculoskeletal negative musculoskeletal ROS (+)   Abdominal   Peds  Hematology negative hematology ROS (+)   Anesthesia Other Findings PAPILLARY THYROID CANCER  Reproductive/Obstetrics hcg negative                           Anesthesia Physical Anesthesia Plan  ASA: II  Anesthesia Plan: General   Post-op Pain Management:    Induction: Intravenous  PONV Risk Score and Plan: 4 or greater and Ondansetron, Dexamethasone, Midazolam, Treatment may vary due to age or medical condition and Scopolamine patch - Pre-op  Airway Management Planned: Oral ETT  Additional Equipment:   Intra-op Plan:   Post-operative Plan: Extubation in OR  Informed Consent: I have reviewed the patients History and Physical, chart, labs and discussed the procedure including the risks, benefits and alternatives for the proposed anesthesia with the patient or authorized representative who has indicated his/her understanding and acceptance.     Dental advisory given  Plan Discussed with: CRNA  Anesthesia Plan Comments: (Reviewed APP note by Durel Salts, FNP )      Anesthesia Quick Evaluation

## 2020-03-16 NOTE — Progress Notes (Signed)
Anesthesia Chart Review:   Case: [id number] Date/Time: 03/19/20 0715   Procedure: TOTAL THYROIDECTOMY WITH LIMITED LYMPH NODE DISSECTION (N/A )   Anesthesia type: General   Pre-op diagnosis: PAPILLARY THYROID CANCER   Location: [facility] ROOM 04 / [facility]   Surgeons: Armandina Gemma, MD      DISCUSSION:  Pt is a 39 year old with hx HTN, LBBB, thyroid cancer  Reviewed case with Dr. Fransisco Beau.    VS: BP 140/90   Pulse 97   Temp 36.8 C (Oral)   Resp 16   Ht 5\' 7"  (1.702 m)   Wt 64.4 kg   LMP 02/21/2020   SpO2 100%   BMI 22.24 kg/m   PROVIDERS: - PCP is Fanny Bien, MD   LABS: Labs reviewed: Acceptable for surgery. (all labs ordered are listed, but only abnormal results are displayed)  Labs Reviewed  BASIC METABOLIC PANEL  CBC     IMAGES:   CXR 03/07/20: No active cardiopulmonary disease.   EKG 10/31/19: NSR. Right atrial enlargement. LVH with QRS widening and repolarization abnormality. Cannot rule out Anteroseptal infarct, age undetermined   CV: Exercise tolerance test 10/04/14 ([place], [place], [place]) - Patient exercised for 6 minutes and 0 seconds on Bruce protocol; achieved 7.1 METS at 91% of maximum predicted HR) - Chest pain is not present. No arrhythmias noted on ECG at rest.  - Peak ECG demonstrated sinus tachycardia and developed LBBB   Echo 10/03/14 ([place], [place], [place]):  - LV cavity normal size. Normal global wall motion. Visual EF is 60-65%.  - Structurally normal MV with trace regurgitation - Structurally normal TV with trace regurgitation   Past Medical History:  Diagnosis Date  . Anxiety   . Hypertension   . Left bundle branch block (LBBB) determined by electrocardiography   . Thyroid cancer [facility])     Past Surgical History:  Procedure Laterality Date  . BREAST BIOPSY    . CHOLECYSTECTOMY      MEDICATIONS: . acetaminophen (TYLENOL) 500 MG tablet  . ibuprofen (ADVIL) 200 MG tablet  .  lisinopril (ZESTRIL) 5 MG tablet  . loratadine (CLARITIN) 10 MG tablet  . Prenatal Vit-Fe Fumarate-FA (PRENATAL MULTIVITAMIN) TABS tablet   No current facility-administered medications for this encounter.    If no changes, I anticipate pt can proceed with surgery as scheduled.   Willeen Cass, PhD, FNP-BC [facility]/Anesthesiology Phone: [phone] 03/16/2020 2:10 PM

## 2020-03-19 ENCOUNTER — Other Ambulatory Visit: Payer: Self-pay

## 2020-03-19 ENCOUNTER — Ambulatory Visit (HOSPITAL_COMMUNITY): Payer: 59 | Admitting: Physician Assistant

## 2020-03-19 ENCOUNTER — Ambulatory Visit (HOSPITAL_COMMUNITY): Payer: 59 | Admitting: Anesthesiology

## 2020-03-19 ENCOUNTER — Encounter (HOSPITAL_COMMUNITY): Payer: Self-pay | Admitting: Surgery

## 2020-03-19 ENCOUNTER — Encounter (HOSPITAL_COMMUNITY)
Admission: RE | Disposition: A | Discharge status: Home / Self Care | Payer: Self-pay | Source: Other Acute Inpatient Hospital | Attending: Surgery

## 2020-03-19 ENCOUNTER — Ambulatory Visit (HOSPITAL_COMMUNITY)
Admission: RE | Admit: 2020-03-19 | Discharge: 2020-03-20 | Disposition: A | Discharge status: Home / Self Care | Payer: 59 | Source: Other Acute Inpatient Hospital | Attending: Surgery | Admitting: Surgery

## 2020-03-19 DIAGNOSIS — C73 Malignant neoplasm of thyroid gland: Secondary | ICD-10-CM | POA: Diagnosis present

## 2020-03-19 DIAGNOSIS — Z9049 Acquired absence of other specified parts of digestive tract: Secondary | ICD-10-CM | POA: Insufficient documentation

## 2020-03-19 DIAGNOSIS — Z79899 Other long term (current) drug therapy: Secondary | ICD-10-CM | POA: Diagnosis not present

## 2020-03-19 DIAGNOSIS — C770 Secondary and unspecified malignant neoplasm of lymph nodes of head, face and neck: Secondary | ICD-10-CM | POA: Diagnosis not present

## 2020-03-19 DIAGNOSIS — J45909 Unspecified asthma, uncomplicated: Secondary | ICD-10-CM | POA: Diagnosis not present

## 2020-03-19 DIAGNOSIS — I10 Essential (primary) hypertension: Secondary | ICD-10-CM | POA: Diagnosis not present

## 2020-03-19 HISTORY — PX: THYROIDECTOMY: SHX17

## 2020-03-19 LAB — PREGNANCY, URINE: Preg Test, Ur: NEGATIVE

## 2020-03-19 SURGERY — THYROIDECTOMY
Anesthesia: General | Site: Neck

## 2020-03-19 MED ORDER — LACTATED RINGERS IV SOLN: INTRAVENOUS | Status: DC

## 2020-03-19 MED ORDER — ESMOLOL HCL 100 MG/10ML IV SOLN
INTRAVENOUS | Status: DC | PRN
  Administered 2020-03-19: 20 mg via INTRAVENOUS

## 2020-03-19 MED ORDER — ONDANSETRON HCL 4 MG/2ML IJ SOLN
INTRAMUSCULAR | Status: AC
  Filled 2020-03-19: qty 2

## 2020-03-19 MED ORDER — OXYCODONE HCL 5 MG PO TABS: 5.0000 mg | ORAL_TABLET | Freq: Once | ORAL | Status: DC | PRN

## 2020-03-19 MED ORDER — SUGAMMADEX SODIUM 200 MG/2ML IV SOLN
INTRAVENOUS | Status: DC | PRN
  Administered 2020-03-19: 140 mg via INTRAVENOUS

## 2020-03-19 MED ORDER — MENTHOL 3 MG MT LOZG
1.0000 | LOZENGE | OROMUCOSAL | Status: DC | PRN
  Administered 2020-03-19 (×2): 3 mg via ORAL
  Filled 2020-03-19 (×2): qty 9

## 2020-03-19 MED ORDER — CALCIUM CARBONATE 1250 (500 CA) MG PO TABS
2.0000 | ORAL_TABLET | Freq: Three times a day (TID) | ORAL | Status: DC
  Administered 2020-03-19 – 2020-03-20 (×3): 1000 mg via ORAL
  Filled 2020-03-19 (×3): qty 1

## 2020-03-19 MED ORDER — PHENYLEPHRINE 40 MCG/ML (10ML) SYRINGE FOR IV PUSH (FOR BLOOD PRESSURE SUPPORT)
PREFILLED_SYRINGE | INTRAVENOUS | Status: DC | PRN
  Administered 2020-03-19 (×2): 40 ug via INTRAVENOUS

## 2020-03-19 MED ORDER — CHLORHEXIDINE GLUCONATE CLOTH 2 % EX PADS: 6.0000 | MEDICATED_PAD | Freq: Once | CUTANEOUS | Status: DC

## 2020-03-19 MED ORDER — FENTANYL CITRATE (PF) 100 MCG/2ML IJ SOLN
INTRAMUSCULAR | Status: AC
  Filled 2020-03-19: qty 2

## 2020-03-19 MED ORDER — SCOPOLAMINE 1 MG/3DAYS TD PT72
1.0000 | MEDICATED_PATCH | TRANSDERMAL | Status: DC
  Filled 2020-03-19: qty 1

## 2020-03-19 MED ORDER — MIDAZOLAM HCL 2 MG/2ML IJ SOLN
INTRAMUSCULAR | Status: DC | PRN
  Administered 2020-03-19: 2 mg via INTRAVENOUS

## 2020-03-19 MED ORDER — PROPOFOL 10 MG/ML IV BOLUS
INTRAVENOUS | Status: AC
  Filled 2020-03-19: qty 40

## 2020-03-19 MED ORDER — DEXAMETHASONE SODIUM PHOSPHATE 10 MG/ML IJ SOLN
INTRAMUSCULAR | Status: AC
  Filled 2020-03-19: qty 1

## 2020-03-19 MED ORDER — ONDANSETRON HCL 4 MG/2ML IJ SOLN: 4.0000 mg | Freq: Four times a day (QID) | INTRAMUSCULAR | Status: DC | PRN

## 2020-03-19 MED ORDER — PROMETHAZINE HCL 25 MG/ML IJ SOLN: 6.2500 mg | INTRAMUSCULAR | Status: DC | PRN

## 2020-03-19 MED ORDER — OXYCODONE HCL 5 MG PO TABS
5.0000 mg | ORAL_TABLET | ORAL | Status: DC | PRN
  Administered 2020-03-19: 5 mg via ORAL
  Administered 2020-03-19: 10 mg via ORAL
  Filled 2020-03-19: qty 2
  Filled 2020-03-19: qty 1

## 2020-03-19 MED ORDER — LIDOCAINE 2% (20 MG/ML) 5 ML SYRINGE
INTRAMUSCULAR | Status: DC | PRN
  Administered 2020-03-19: 40 mg via INTRAVENOUS

## 2020-03-19 MED ORDER — SCOPOLAMINE 1 MG/3DAYS TD PT72
MEDICATED_PATCH | TRANSDERMAL | Status: DC | PRN
  Administered 2020-03-19: 1 via TRANSDERMAL

## 2020-03-19 MED ORDER — ROCURONIUM BROMIDE 10 MG/ML (PF) SYRINGE
PREFILLED_SYRINGE | INTRAVENOUS | Status: AC
  Filled 2020-03-19: qty 10

## 2020-03-19 MED ORDER — LIDOCAINE 2% (20 MG/ML) 5 ML SYRINGE
INTRAMUSCULAR | Status: AC
  Filled 2020-03-19: qty 5

## 2020-03-19 MED ORDER — OXYCODONE HCL 5 MG/5ML PO SOLN: 5.0000 mg | Freq: Once | ORAL | Status: DC | PRN

## 2020-03-19 MED ORDER — ACETAMINOPHEN 500 MG PO TABS
ORAL_TABLET | ORAL | Status: AC
  Administered 2020-03-19: 1000 mg via ORAL
  Filled 2020-03-19: qty 2

## 2020-03-19 MED ORDER — ACETAMINOPHEN 650 MG RE SUPP: 650.0000 mg | Freq: Four times a day (QID) | RECTAL | Status: DC | PRN

## 2020-03-19 MED ORDER — CEFAZOLIN SODIUM-DEXTROSE 2-4 GM/100ML-% IV SOLN
2.0000 g | INTRAVENOUS | Status: AC
  Administered 2020-03-19: 2 g via INTRAVENOUS

## 2020-03-19 MED ORDER — CEFAZOLIN SODIUM-DEXTROSE 2-4 GM/100ML-% IV SOLN
INTRAVENOUS | Status: AC
  Filled 2020-03-19: qty 100

## 2020-03-19 MED ORDER — LISINOPRIL 5 MG PO TABS
5.0000 mg | ORAL_TABLET | Freq: Every day | ORAL | Status: DC
  Administered 2020-03-19: 5 mg via ORAL
  Filled 2020-03-19: qty 1

## 2020-03-19 MED ORDER — FENTANYL CITRATE (PF) 250 MCG/5ML IJ SOLN
INTRAMUSCULAR | Status: DC | PRN
  Administered 2020-03-19: 100 ug via INTRAVENOUS
  Administered 2020-03-19: 50 ug via INTRAVENOUS
  Administered 2020-03-19: 100 ug via INTRAVENOUS
  Administered 2020-03-19: 50 ug via INTRAVENOUS

## 2020-03-19 MED ORDER — DEXAMETHASONE SODIUM PHOSPHATE 10 MG/ML IJ SOLN
INTRAMUSCULAR | Status: DC | PRN
  Administered 2020-03-19: 8 mg via INTRAVENOUS

## 2020-03-19 MED ORDER — FENTANYL CITRATE (PF) 100 MCG/2ML IJ SOLN
INTRAMUSCULAR | Status: AC
Start: 2020-03-19 — End: ?
  Filled 2020-03-19: qty 2

## 2020-03-19 MED ORDER — HYDROMORPHONE HCL 1 MG/ML IJ SOLN
1.0000 mg | INTRAMUSCULAR | Status: DC | PRN
  Administered 2020-03-19: 13:00:00 1 mg via INTRAVENOUS
  Filled 2020-03-19: qty 1

## 2020-03-19 MED ORDER — 0.9 % SODIUM CHLORIDE (POUR BTL) OPTIME
TOPICAL | Status: DC | PRN
  Administered 2020-03-19: 1000 mL

## 2020-03-19 MED ORDER — ACETAMINOPHEN 325 MG PO TABS: 650.0000 mg | ORAL_TABLET | Freq: Four times a day (QID) | ORAL | Status: DC | PRN

## 2020-03-19 MED ORDER — SODIUM CHLORIDE 0.45 % IV SOLN: INTRAVENOUS | Status: DC

## 2020-03-19 MED ORDER — ROCURONIUM BROMIDE 10 MG/ML (PF) SYRINGE
PREFILLED_SYRINGE | INTRAVENOUS | Status: DC | PRN
  Administered 2020-03-19: 40 mg via INTRAVENOUS
  Administered 2020-03-19: 10 mg via INTRAVENOUS

## 2020-03-19 MED ORDER — ONDANSETRON 4 MG PO TBDP: 4.0000 mg | ORAL_TABLET | Freq: Four times a day (QID) | ORAL | Status: DC | PRN

## 2020-03-19 MED ORDER — ORAL CARE MOUTH RINSE: 15.0000 mL | Freq: Once | OROMUCOSAL | Status: AC

## 2020-03-19 MED ORDER — CHLORHEXIDINE GLUCONATE 0.12 % MT SOLN
15.0000 mL | Freq: Once | OROMUCOSAL | Status: AC
  Administered 2020-03-19: 15 mL via OROMUCOSAL

## 2020-03-19 MED ORDER — TRAMADOL HCL 50 MG PO TABS: 50.0000 mg | ORAL_TABLET | Freq: Four times a day (QID) | ORAL | Status: DC | PRN

## 2020-03-19 MED ORDER — ONDANSETRON HCL 4 MG/2ML IJ SOLN
INTRAMUSCULAR | Status: DC | PRN
  Administered 2020-03-19: 4 mg via INTRAVENOUS

## 2020-03-19 MED ORDER — KETOROLAC TROMETHAMINE 30 MG/ML IJ SOLN: 30.0000 mg | Freq: Once | INTRAMUSCULAR | Status: DC | PRN

## 2020-03-19 MED ORDER — FENTANYL CITRATE (PF) 100 MCG/2ML IJ SOLN
25.0000 ug | INTRAMUSCULAR | Status: DC | PRN
  Administered 2020-03-19 (×2): 25 ug via INTRAVENOUS

## 2020-03-19 MED ORDER — PROPOFOL 10 MG/ML IV BOLUS
INTRAVENOUS | Status: DC | PRN
  Administered 2020-03-19: 100 mg via INTRAVENOUS
  Administered 2020-03-19 (×2): 50 mg via INTRAVENOUS

## 2020-03-19 MED ORDER — MIDAZOLAM HCL 2 MG/2ML IJ SOLN
INTRAMUSCULAR | Status: AC
  Filled 2020-03-19: qty 2

## 2020-03-19 MED ORDER — ACETAMINOPHEN 500 MG PO TABS: 1000.0000 mg | ORAL_TABLET | Freq: Once | ORAL | Status: AC

## 2020-03-19 SURGICAL SUPPLY — 28 items
ATTRACTOMAT 16X20 MAGNETIC DRP (DRAPES) ×2 IMPLANT
BLADE SURG 15 STRL LF DISP TIS (BLADE) ×1 IMPLANT
BLADE SURG 15 STRL SS (BLADE) ×1
CHLORAPREP W/TINT 26 (MISCELLANEOUS) ×2 IMPLANT
CLIP VESOCCLUDE MED 6/CT (CLIP) ×8 IMPLANT
CLIP VESOCCLUDE SM WIDE 6/CT (CLIP) ×8 IMPLANT
COVER SURGICAL LIGHT HANDLE (MISCELLANEOUS) ×2 IMPLANT
COVER WAND RF STERILE (DRAPES) IMPLANT
DERMABOND ADVANCED (GAUZE/BANDAGES/DRESSINGS) ×1
DERMABOND ADVANCED .7 DNX12 (GAUZE/BANDAGES/DRESSINGS) ×1 IMPLANT
DRAPE LAPAROTOMY T 98X78 PEDS (DRAPES) ×2 IMPLANT
ELECT PENCIL ROCKER SW 15FT (MISCELLANEOUS) ×2 IMPLANT
ELECT REM PT RETURN 15FT ADLT (MISCELLANEOUS) ×2 IMPLANT
GAUZE 4X4 16PLY RFD (DISPOSABLE) ×4 IMPLANT
GLOVE SURG ORTHO 8.0 STRL STRW (GLOVE) ×2 IMPLANT
GOWN STRL REUS W/TWL XL LVL3 (GOWN DISPOSABLE) ×4 IMPLANT
HEMOSTAT SURGICEL 2X4 FIBR (HEMOSTASIS) ×2 IMPLANT
ILLUMINATOR WAVEGUIDE N/F (MISCELLANEOUS) ×2 IMPLANT
KIT BASIN OR (CUSTOM PROCEDURE TRAY) ×2 IMPLANT
KIT TURNOVER KIT A (KITS) ×2 IMPLANT
PACK BASIC VI WITH GOWN DISP (CUSTOM PROCEDURE TRAY) ×2 IMPLANT
SHEARS HARMONIC 9CM CVD (BLADE) ×2 IMPLANT
SUT MNCRL AB 4-0 PS2 18 (SUTURE) ×2 IMPLANT
SUT VIC AB 3-0 SH 18 (SUTURE) ×4 IMPLANT
SYR BULB IRRIG 60ML STRL (SYRINGE) ×2 IMPLANT
TOWEL OR 17X26 10 PK STRL BLUE (TOWEL DISPOSABLE) ×2 IMPLANT
TOWEL OR NON WOVEN STRL DISP B (DISPOSABLE) ×2 IMPLANT
TUBING CONNECTING 10 (TUBING) ×2 IMPLANT

## 2020-03-19 NOTE — Transfer of Care (Signed)
Immediate Anesthesia Transfer of Care Note  Patient: Mindy [name]  Procedure(s) Performed: TOTAL THYROIDECTOMY WITH LIMITED CENTRAL COMPARTMENT LYMPH NODE DISSECTION (N/A Neck)  Patient Location: PACU  Anesthesia Type:General  Level of Consciousness: awake, drowsy and patient cooperative  Airway & Oxygen Therapy: Patient Spontanous Breathing and Patient connected to face mask oxygen  Post-op Assessment: Report given to RN and Post -op Vital signs reviewed and stable  Post vital signs: Reviewed and stable  Last Vitals:  Vitals Value Taken Time  BP 136/97 03/19/20 0948  Temp    Pulse 76 03/19/20 0951  Resp 6 03/19/20 0951  SpO2 100 % 03/19/20 0951  Vitals shown include unvalidated device data.  Last Pain:  Vitals:   03/19/20 0609  TempSrc: Oral         Complications: No complications documented.

## 2020-03-19 NOTE — Interval H&P Note (Signed)
History and Physical Interval Note:  03/19/2020 6:58 AM  Mindy [name]  has presented today for surgery, with the diagnosis of PAPILLARY THYROID CANCER.  The various methods of treatment have been discussed with the patient and family. After consideration of risks, benefits and other options for treatment, the patient has consented to    Procedure(s): TOTAL THYROIDECTOMY WITH LIMITED LYMPH NODE DISSECTION (N/A) as a surgical intervention.    The patient's history has been reviewed, patient examined, no change in status, stable for surgery.  I have reviewed the patient's chart and labs.  Questions were answered to the patient's satisfaction.    Armandina Gemma, MD [facility] Office: [place]

## 2020-03-19 NOTE — Plan of Care (Signed)
Patient is progressing well and notes she is feeling better since arriving from PACU. Pain has been controlled. Patient is ambulating and voiding. Patient educated on the importance of mobility as well as using the IS. Surgical incision clean, dry, intact.

## 2020-03-19 NOTE — Anesthesia Procedure Notes (Signed)
Procedure Name: Intubation Date/Time: 03/19/2020 7:28 AM Performed by: Eben Burow, CRNA Pre-anesthesia Checklist: Patient identified, Emergency Drugs available, Suction available, Patient being monitored and Timeout performed Patient Re-evaluated:Patient Re-evaluated prior to induction Oxygen Delivery Method: Circle system utilized Preoxygenation: Pre-oxygenation with 100% oxygen Induction Type: IV induction Ventilation: Mask ventilation without difficulty Laryngoscope Size: Mac and 4 Grade View: Grade I Tube type: Oral Tube size: 7.0 mm Number of attempts: 1 Airway Equipment and Method: Stylet Placement Confirmation: ETT inserted through vocal cords under direct vision,  positive ETCO2 and breath sounds checked- equal and bilateral Secured at: 22 cm Tube secured with: Tape Dental Injury: Teeth and Oropharynx as per pre-operative assessment

## 2020-03-19 NOTE — Op Note (Signed)
Procedure Note  Pre-operative Diagnosis:  Papillary thyroid carcinoma  Post-operative Diagnosis:  same  Surgeon:  Armandina Gemma, MD  Assistant:  Carlena Hurl, PA-C   Procedure:  Total thyroidectomy with limited central compartment lymph node dissection  Anesthesia:  General  Estimated Blood Loss:  minimal  Drains: none         Specimen: thyroid to pathology  Indications:  Patient is referred by Dr. Rachell Cipro for surgical evaluation and management of newly diagnosed papillary thyroid carcinoma. Patient will see Dr. Dagmar Hait in consultation from endocrinology. Patient noted a new mass in the right anterior neck approximately one month ago on self-examination. She presented to her primary care physician and was referred for ultrasound. This study showed a mildly enlarged thyroid gland with a solitary dominant nodule in the right superior position measuring 3.4 cm with echogenic foci and peripheral calcifications. It was irregular. It was felt to be highly suspicious and fine-needle aspiration biopsy was recommended. Patient underwent biopsy on February 09, 2020. This demonstrated papillary thyroid carcinoma, Bethesda category VI. Patient was referred to surgery for thyroidectomy.   Procedure Details: Procedure was done in OR #4 at the [facility]. The patient was brought to the operating room and placed in a supine position on the operating room table. Following administration of general anesthesia, the patient was positioned and then prepped and draped in the usual aseptic fashion. After ascertaining that an adequate level of anesthesia had been achieved, a small Kocher incision was made with #15 blade. Dissection was carried through subcutaneous tissues and platysma.Hemostasis was achieved with the electrocautery. Skin flaps were elevated cephalad and caudad from the thyroid notch to the sternal notch. A Mahorner self-retaining retractor was placed for exposure. Strap  muscles were incised in the midline and dissection was begun on the left side.  Strap muscles were reflected laterally.  Left thyroid lobe was normal in size and soft.  The left lobe was gently mobilized with blunt dissection. Superior pole vessels were dissected out and divided individually between small and medium ligaclips with the harmonic scalpel. The thyroid lobe was rolled anteriorly. Branches of the inferior thyroid artery were divided between small ligaclips with the harmonic scalpel. Inferior venous tributaries were divided between ligaclips. Both the superior and inferior parathyroid glands were identified and preserved on their vascular pedicles. The recurrent laryngeal nerve was identified and preserved along its course. The ligament of Gwenlyn Found was released with the electrocautery and the gland was mobilized onto the anterior trachea. Isthmus was mobilized across the midline. There was a moderate sized pyramidal lobe present which was dissected off of the thyroid cartilage and resected en bloc with the isthmus. Dry pack was placed in the left neck.  The right thyroid lobe was gently mobilized with blunt dissection. Right thyroid lobe was slightly enlarged with a dominant firm nodule in the superior portion of the gland.  The mass appears to be slightly invading the overlying strap muscle.  Muscle is resected with the lobe. Superior pole vessels were dissected out and divided between small and medium ligaclips with the Harmonic scalpel. Inferior venous tributaries were divided between medium ligaclips with the harmonic scalpel. The right thyroid lobe was rolled anteriorly and the branches of the inferior thyroid artery divided between small ligaclips. The right recurrent laryngeal nerve was identified and preserved along its course. The ligament of Gwenlyn Found was released with the electrocautery. The right thyroid lobe was mobilized onto the anterior trachea and the remainder of the thyroid was  dissected off  the anterior trachea and the thyroid was completely excised. A suture was used to mark the right lobe. The entire thyroid gland was submitted to pathology for review.  There were prominent lymph nodes present in the upper central compartment, Zone VI.  These were dissected out and resected using the harmonic scalpel.  Larger venous tributaries were divided between medium ligaclips.  Tissue was removed and submitted to pathology for review.  The neck was irrigated with warm saline. Fibrillar was placed throughout the operative field. Strap muscles were approximated in the midline with interrupted 3-0 Vicryl sutures. Platysma was closed with interrupted 3-0 Vicryl sutures. Skin was closed with a running 4-0 Monocryl subcuticular suture. Wound was washed and Dermabond was applied. The patient was awakened from anesthesia and brought to the recovery room. The patient tolerated the procedure well.   Armandina Gemma, MD [facility] Office: [phone]

## 2020-03-19 NOTE — Anesthesia Postprocedure Evaluation (Signed)
Anesthesia Post Note  Patient: Mindy [name]  Procedure(s) Performed: TOTAL THYROIDECTOMY WITH LIMITED CENTRAL COMPARTMENT LYMPH NODE DISSECTION (N/A Neck)     Patient location during evaluation: PACU Anesthesia Type: General Level of consciousness: awake and alert Pain management: pain level controlled Vital Signs Assessment: post-procedure vital signs reviewed and stable Respiratory status: spontaneous breathing, nonlabored ventilation, respiratory function stable and patient connected to nasal cannula oxygen Cardiovascular status: blood pressure returned to baseline and stable Postop Assessment: no apparent nausea or vomiting Anesthetic complications: no   No complications documented.  Last Vitals:  Vitals:   03/19/20 1747 03/19/20 2013  BP: 117/74 123/80  Pulse: 68 66  Resp: 16 16  Temp: 36.8 C 36.8 C  SpO2: 100% 100%    Last Pain:  Vitals:   03/19/20 2013  TempSrc: Oral  PainSc:                  Karyl Kinnier Aoife Bold

## 2020-03-20 ENCOUNTER — Encounter (HOSPITAL_COMMUNITY): Payer: Self-pay | Admitting: Surgery

## 2020-03-20 DIAGNOSIS — C73 Malignant neoplasm of thyroid gland: Secondary | ICD-10-CM | POA: Diagnosis not present

## 2020-03-20 LAB — BASIC METABOLIC PANEL
Anion gap: 7 (ref 5–15)
BUN: 6 mg/dL (ref 6–20)
CO2: 27 mmol/L (ref 22–32)
Calcium: 8.7 mg/dL — ABNORMAL LOW (ref 8.9–10.3)
Chloride: 103 mmol/L (ref 98–111)
Creatinine, Ser: 0.69 mg/dL (ref 0.44–1.00)
GFR, Estimated: >60 mL/min (ref >60)
Glucose, Bld: 97 mg/dL (ref 70–99)
Potassium: 3.7 mmol/L (ref 3.5–5.1)
Sodium: 137 mmol/L (ref 135–145)

## 2020-03-20 LAB — SURGICAL PATHOLOGY

## 2020-03-20 MED ORDER — LEVOTHYROXINE SODIUM 100 MCG PO TABS
100.0000 ug | ORAL_TABLET | Freq: Every day | ORAL | 2 refills | Status: DC
Start: 2020-03-20 — End: 2022-08-07

## 2020-03-20 MED ORDER — CALCIUM CARBONATE ANTACID 500 MG PO CHEW: 2.0000 | CHEWABLE_TABLET | Freq: Three times a day (TID) | ORAL | 1 refills | Status: DC

## 2020-03-20 MED ORDER — OXYCODONE HCL 5 MG PO TABS
5.0000 mg | ORAL_TABLET | Freq: Four times a day (QID) | ORAL | 0 refills | Status: DC | PRN
Start: 2020-03-20 — End: 2022-08-07

## 2020-03-20 NOTE — Progress Notes (Signed)
RN reviewed discharge instructions with patient and family. All questions answered.   Paperwork given. Prescriptions electronically sent to patient pharmacy.    RN walked with patient down to family vehicle.    Benedetto Goad, RN

## 2020-03-20 NOTE — Discharge Summary (Signed)
Physician Discharge Summary [facility]  Patient ID: Mindy [name] MRN: [id number] DOB/AGE: [birth year] 39 y.o.  Admit date: 03/19/2020 Discharge date: 03/20/2020  Admission Diagnoses:  Papillary thyroid carcinoma  Discharge Diagnoses:  Principal Problem:   Papillary thyroid carcinoma [facility])   Discharged Condition: good  Hospital Course: Patient was admitted for observation following thyroid surgery.  Post op course was uncomplicated.  Pain was well controlled.  Tolerated diet.  Post op calcium level on morning following surgery was 8.7 mg/dl.  Patient was prepared for discharge home on POD#1.  Consults: None  Treatments: surgery: total thyroidectomy with limited lymph node dissection  Discharge Exam: Blood pressure 120/79, pulse 65, temperature 98.9 F (37.2 C), temperature source Oral, resp. rate 16, height 5\' 7"  (1.702 m), weight 63.5 kg, last menstrual period 03/17/2020, SpO2 100 %. HEENT - clear Neck - wound dry and intact; voice soft without raspiness or stridor; Dermabond in place Chest - clear bilaterally Cor - RRR   Disposition: Home  Discharge Instructions    Diet - low sodium heart healthy   Complete by: As directed    Discharge instructions   Complete by: As directed    [facility]  THYROID & PARATHYROID SURGERY:  POST-OP INSTRUCTIONS  Always review your discharge instruction sheet from the facility where your surgery was performed.  A prescription for pain medication may be given to you upon discharge.  Take your pain medication as prescribed.  If narcotic pain medicine is not needed, then you may take acetaminophen (Tylenol) or ibuprofen (Advil) as needed.  Take your usually prescribed medications unless otherwise directed.  If you need a refill on your pain medication, please contact our office during regular business hours.  Prescriptions cannot be processed by our office after 5 pm or on weekends.  Start with  a light diet upon arrival home, such as soup and crackers or toast.  Be sure to drink plenty of fluids daily.  Resume your normal diet the day after surgery.  Most patients will experience some swelling and bruising on the chest and neck area.  Ice packs will help.  Swelling and bruising can take several days to resolve.   It is common to experience some constipation after surgery.  Increasing fluid intake and taking a stool softener (Colace) will usually help or prevent this problem.  A mild laxative (Milk of Magnesia or Miralax) should be taken according to package directions if there has been no bowel movement after 48 hours.  You have steri-strips and a gauze dressing over your incision.  You may remove the gauze bandage on the second day after surgery, and you may shower at that time.  Leave your steri-strips (small skin tapes) in place directly over the incision.  These strips should remain on the skin for 5-7 days and then be removed.  You may get them wet in the shower and pat them dry.  You may resume regular (light) daily activities beginning the next day (such as daily self-care, walking, climbing stairs) gradually increasing activities as tolerated.  You may have sexual intercourse when it is comfortable.  Refrain from any heavy lifting or straining until approved by your doctor.  You may drive when you no longer are taking prescription pain medication, you can comfortably wear a seatbelt, and you can safely maneuver your car and apply brakes.  You should see your doctor in the office for a follow-up appointment approximately three weeks after your surgery.  Make sure that you call for this appointment within a day or two after you arrive home to insure a convenient appointment time.  WHEN TO CALL YOUR DOCTOR: -- Fever greater than 101.5 -- Inability to urinate -- Nausea and/or vomiting - persistent -- Extreme swelling or bruising -- Continued bleeding from incision -- Increased pain,  redness, or drainage from the incision -- Difficulty swallowing or breathing -- Muscle cramping or spasms -- Numbness or tingling in hands or around lips  The clinic staff is available to answer your questions during regular business hours.  Please don't hesitate to call and ask to speak to one of the nurses if you have concerns.  Armandina Gemma, MD [facility] Office: [phone]   Increase activity slowly   Complete by: As directed    No dressing needed   Complete by: As directed      Allergies as of 03/20/2020   No Known Allergies     Medication List    TAKE these medications   acetaminophen 500 MG tablet Commonly known as: TYLENOL Take 1,000 mg by mouth daily as needed for moderate pain or headache.   calcium carbonate 500 MG chewable tablet Commonly known as: Tums Chew 2 tablets (400 mg of elemental calcium total) by mouth 3 (three) times daily.   ibuprofen 200 MG tablet Commonly known as: ADVIL Take 200 mg by mouth daily as needed for headache or moderate pain.   levothyroxine 100 MCG tablet Commonly known as: Synthroid Take 1 tablet (100 mcg total) by mouth daily.   lisinopril 5 MG tablet Commonly known as: ZESTRIL Take 5 mg by mouth daily.   loratadine 10 MG tablet Commonly known as: CLARITIN Take 10 mg by mouth daily as needed for allergies.   oxyCODONE 5 MG immediate release tablet Commonly known as: Oxy IR/ROXICODONE Take 1-2 tablets (5-10 mg total) by mouth every 6 (six) hours as needed for moderate pain.   prenatal multivitamin Tabs tablet Take 1 tablet by mouth daily.            Discharge Care Instructions  (From admission, onward)         Start     Ordered   03/20/20 0000  No dressing needed        03/20/20 3646           Earnstine Regal, MD, [facility] Office: [phone]   Signed: Armandina Gemma 03/20/2020, 8:22 AM

## 2020-04-02 ENCOUNTER — Other Ambulatory Visit: Payer: Self-pay | Admitting: Internal Medicine

## 2020-04-02 DIAGNOSIS — C73 Malignant neoplasm of thyroid gland: Secondary | ICD-10-CM

## 2020-04-02 DIAGNOSIS — C770 Secondary and unspecified malignant neoplasm of lymph nodes of head, face and neck: Secondary | ICD-10-CM

## 2020-04-16 ENCOUNTER — Ambulatory Visit
Admission: RE | Admit: 2020-04-16 | Discharge: 2020-04-16 | Disposition: A | Discharge status: Home / Self Care | Payer: 59 | Source: Ambulatory Visit | Attending: Internal Medicine | Admitting: Internal Medicine

## 2020-04-16 DIAGNOSIS — C770 Secondary and unspecified malignant neoplasm of lymph nodes of head, face and neck: Secondary | ICD-10-CM

## 2020-04-16 DIAGNOSIS — C73 Malignant neoplasm of thyroid gland: Secondary | ICD-10-CM

## 2020-05-16 ENCOUNTER — Other Ambulatory Visit (HOSPITAL_COMMUNITY): Payer: Self-pay | Admitting: Internal Medicine

## 2020-05-16 DIAGNOSIS — C73 Malignant neoplasm of thyroid gland: Secondary | ICD-10-CM

## 2020-05-28 ENCOUNTER — Encounter (HOSPITAL_COMMUNITY)
Admission: RE | Admit: 2020-05-28 | Discharge: 2020-05-28 | Disposition: A | Discharge status: Home / Self Care | Payer: 59 | Source: Ambulatory Visit | Attending: Internal Medicine | Admitting: Internal Medicine

## 2020-05-28 ENCOUNTER — Other Ambulatory Visit: Payer: Self-pay

## 2020-05-28 DIAGNOSIS — C73 Malignant neoplasm of thyroid gland: Secondary | ICD-10-CM | POA: Diagnosis not present

## 2020-05-28 MED ORDER — STERILE WATER FOR INJECTION IJ SOLN
INTRAMUSCULAR | Status: AC
  Filled 2020-05-28: qty 10

## 2020-05-28 MED ORDER — THYROTROPIN ALFA 0.9 MG IM SOLR
0.9000 mg | INTRAMUSCULAR | Status: AC
  Administered 2020-05-28: 0.9 mg via INTRAMUSCULAR

## 2020-05-29 ENCOUNTER — Encounter (HOSPITAL_COMMUNITY)
Admission: RE | Admit: 2020-05-29 | Discharge: 2020-05-29 | Disposition: A | Discharge status: Home / Self Care | Payer: 59 | Source: Ambulatory Visit | Attending: Internal Medicine | Admitting: Internal Medicine

## 2020-05-29 DIAGNOSIS — C73 Malignant neoplasm of thyroid gland: Secondary | ICD-10-CM | POA: Diagnosis not present

## 2020-05-29 MED ORDER — THYROTROPIN ALFA 0.9 MG IM SOLR
0.9000 mg | INTRAMUSCULAR | Status: AC
  Administered 2020-05-29: 0.9 mg via INTRAMUSCULAR

## 2020-05-30 ENCOUNTER — Ambulatory Visit (HOSPITAL_COMMUNITY): Payer: 59

## 2020-05-30 ENCOUNTER — Other Ambulatory Visit: Payer: Self-pay

## 2020-05-30 ENCOUNTER — Encounter (HOSPITAL_COMMUNITY)
Admission: RE | Admit: 2020-05-30 | Discharge: 2020-05-30 | Disposition: A | Discharge status: Home / Self Care | Payer: 59 | Source: Ambulatory Visit | Attending: Internal Medicine | Admitting: Internal Medicine

## 2020-05-30 DIAGNOSIS — C73 Malignant neoplasm of thyroid gland: Secondary | ICD-10-CM | POA: Diagnosis not present

## 2020-05-30 LAB — HCG, SERUM, QUALITATIVE: Preg, Serum: NEGATIVE

## 2020-05-30 MED ORDER — SODIUM IODIDE I 131 CAPSULE
121.7000 | Freq: Once | INTRAVENOUS | Status: AC | PRN
  Administered 2020-05-30: 121.7 via ORAL

## 2020-06-07 ENCOUNTER — Other Ambulatory Visit: Payer: Self-pay

## 2020-06-07 ENCOUNTER — Encounter (HOSPITAL_COMMUNITY)
Admission: RE | Admit: 2020-06-07 | Discharge: 2020-06-07 | Disposition: A | Discharge status: Home / Self Care | Payer: 59 | Source: Ambulatory Visit | Attending: Internal Medicine | Admitting: Internal Medicine

## 2020-06-07 DIAGNOSIS — C73 Malignant neoplasm of thyroid gland: Secondary | ICD-10-CM | POA: Diagnosis present

## 2020-11-14 DIAGNOSIS — C770 Secondary and unspecified malignant neoplasm of lymph nodes of head, face and neck: Secondary | ICD-10-CM | POA: Diagnosis not present

## 2020-11-14 DIAGNOSIS — C73 Malignant neoplasm of thyroid gland: Secondary | ICD-10-CM | POA: Diagnosis not present

## 2021-01-08 DIAGNOSIS — K1123 Chronic sialoadenitis: Secondary | ICD-10-CM | POA: Diagnosis not present

## 2021-01-25 DIAGNOSIS — Z0000 Encounter for general adult medical examination without abnormal findings: Secondary | ICD-10-CM | POA: Diagnosis not present

## 2021-01-28 DIAGNOSIS — Z0000 Encounter for general adult medical examination without abnormal findings: Secondary | ICD-10-CM | POA: Diagnosis not present

## 2021-01-28 DIAGNOSIS — C73 Malignant neoplasm of thyroid gland: Secondary | ICD-10-CM | POA: Diagnosis not present

## 2021-03-04 DIAGNOSIS — D37030 Neoplasm of uncertain behavior of the parotid salivary glands: Secondary | ICD-10-CM | POA: Diagnosis not present

## 2021-03-06 ENCOUNTER — Other Ambulatory Visit: Payer: Self-pay | Admitting: Otolaryngology

## 2021-03-06 DIAGNOSIS — K118 Other diseases of salivary glands: Secondary | ICD-10-CM

## 2021-03-11 DIAGNOSIS — Z1151 Encounter for screening for human papillomavirus (HPV): Secondary | ICD-10-CM | POA: Diagnosis not present

## 2021-03-11 DIAGNOSIS — Z7185 Encounter for immunization safety counseling: Secondary | ICD-10-CM | POA: Diagnosis not present

## 2021-03-11 DIAGNOSIS — Z118 Encounter for screening for other infectious and parasitic diseases: Secondary | ICD-10-CM | POA: Diagnosis not present

## 2021-03-11 DIAGNOSIS — Z113 Encounter for screening for infections with a predominantly sexual mode of transmission: Secondary | ICD-10-CM | POA: Diagnosis not present

## 2021-03-11 DIAGNOSIS — R030 Elevated blood-pressure reading, without diagnosis of hypertension: Secondary | ICD-10-CM | POA: Diagnosis not present

## 2021-03-11 DIAGNOSIS — Z01419 Encounter for gynecological examination (general) (routine) without abnormal findings: Secondary | ICD-10-CM | POA: Diagnosis not present

## 2021-03-12 DIAGNOSIS — E890 Postprocedural hypothyroidism: Secondary | ICD-10-CM | POA: Diagnosis not present

## 2021-03-12 DIAGNOSIS — C770 Secondary and unspecified malignant neoplasm of lymph nodes of head, face and neck: Secondary | ICD-10-CM | POA: Diagnosis not present

## 2021-03-12 DIAGNOSIS — C73 Malignant neoplasm of thyroid gland: Secondary | ICD-10-CM | POA: Diagnosis not present

## 2021-03-12 DIAGNOSIS — K119 Disease of salivary gland, unspecified: Secondary | ICD-10-CM | POA: Diagnosis not present

## 2021-03-25 ENCOUNTER — Ambulatory Visit
Admission: RE | Admit: 2021-03-25 | Discharge: 2021-03-25 | Disposition: A | Discharge status: Home / Self Care | Payer: BC Managed Care – PPO | Source: Ambulatory Visit | Attending: Otolaryngology | Admitting: Otolaryngology

## 2021-03-25 DIAGNOSIS — Z85850 Personal history of malignant neoplasm of thyroid: Secondary | ICD-10-CM | POA: Diagnosis not present

## 2021-03-25 DIAGNOSIS — E890 Postprocedural hypothyroidism: Secondary | ICD-10-CM | POA: Diagnosis not present

## 2021-03-25 DIAGNOSIS — R220 Localized swelling, mass and lump, head: Secondary | ICD-10-CM | POA: Diagnosis not present

## 2021-03-25 DIAGNOSIS — K118 Other diseases of salivary glands: Secondary | ICD-10-CM

## 2021-03-25 DIAGNOSIS — E237 Disorder of pituitary gland, unspecified: Secondary | ICD-10-CM | POA: Diagnosis not present

## 2021-03-25 MED ORDER — IOPAMIDOL (ISOVUE-300) INJECTION 61%
75.0000 mL | Freq: Once | INTRAVENOUS | Status: AC | PRN
  Administered 2021-03-25: 75 mL via INTRAVENOUS

## 2021-04-02 DIAGNOSIS — K1123 Chronic sialoadenitis: Secondary | ICD-10-CM | POA: Diagnosis not present

## 2021-04-04 ENCOUNTER — Other Ambulatory Visit: Payer: Self-pay | Admitting: Otolaryngology

## 2021-04-04 DIAGNOSIS — E236 Other disorders of pituitary gland: Secondary | ICD-10-CM

## 2021-04-27 ENCOUNTER — Other Ambulatory Visit: Payer: Self-pay

## 2021-04-27 ENCOUNTER — Ambulatory Visit
Admission: RE | Admit: 2021-04-27 | Discharge: 2021-04-27 | Disposition: A | Discharge status: Home / Self Care | Payer: BC Managed Care – PPO | Source: Ambulatory Visit | Attending: Otolaryngology | Admitting: Otolaryngology

## 2021-04-27 DIAGNOSIS — E236 Other disorders of pituitary gland: Secondary | ICD-10-CM

## 2021-04-27 DIAGNOSIS — E237 Disorder of pituitary gland, unspecified: Secondary | ICD-10-CM | POA: Diagnosis not present

## 2021-04-27 MED ORDER — GADOBENATE DIMEGLUMINE 529 MG/ML IV SOLN
7.0000 mL | Freq: Once | INTRAVENOUS | Status: AC | PRN
  Administered 2021-04-27: 7 mL via INTRAVENOUS

## 2021-05-06 DIAGNOSIS — I10 Essential (primary) hypertension: Secondary | ICD-10-CM | POA: Diagnosis not present

## 2021-05-06 DIAGNOSIS — D352 Benign neoplasm of pituitary gland: Secondary | ICD-10-CM | POA: Diagnosis not present

## 2021-05-07 DIAGNOSIS — D352 Benign neoplasm of pituitary gland: Secondary | ICD-10-CM | POA: Diagnosis not present

## 2021-07-03 DIAGNOSIS — C770 Secondary and unspecified malignant neoplasm of lymph nodes of head, face and neck: Secondary | ICD-10-CM | POA: Diagnosis not present

## 2021-07-03 DIAGNOSIS — E890 Postprocedural hypothyroidism: Secondary | ICD-10-CM | POA: Diagnosis not present

## 2021-07-03 DIAGNOSIS — C73 Malignant neoplasm of thyroid gland: Secondary | ICD-10-CM | POA: Diagnosis not present

## 2021-08-05 DIAGNOSIS — E890 Postprocedural hypothyroidism: Secondary | ICD-10-CM | POA: Diagnosis not present

## 2021-08-20 DIAGNOSIS — C770 Secondary and unspecified malignant neoplasm of lymph nodes of head, face and neck: Secondary | ICD-10-CM | POA: Diagnosis not present

## 2021-08-20 DIAGNOSIS — C73 Malignant neoplasm of thyroid gland: Secondary | ICD-10-CM | POA: Diagnosis not present

## 2021-08-20 DIAGNOSIS — E890 Postprocedural hypothyroidism: Secondary | ICD-10-CM | POA: Diagnosis not present

## 2021-10-01 ENCOUNTER — Other Ambulatory Visit: Payer: Self-pay | Admitting: Neurosurgery

## 2021-10-01 DIAGNOSIS — E890 Postprocedural hypothyroidism: Secondary | ICD-10-CM | POA: Diagnosis not present

## 2021-10-01 DIAGNOSIS — D352 Benign neoplasm of pituitary gland: Secondary | ICD-10-CM

## 2021-10-07 DIAGNOSIS — H5213 Myopia, bilateral: Secondary | ICD-10-CM | POA: Diagnosis not present

## 2021-10-12 ENCOUNTER — Ambulatory Visit
Admission: RE | Admit: 2021-10-12 | Discharge: 2021-10-12 | Disposition: A | Discharge status: Home / Self Care | Payer: BC Managed Care – PPO | Source: Ambulatory Visit | Attending: Neurosurgery | Admitting: Neurosurgery

## 2021-10-12 DIAGNOSIS — D352 Benign neoplasm of pituitary gland: Secondary | ICD-10-CM

## 2021-10-12 MED ORDER — GADOBENATE DIMEGLUMINE 529 MG/ML IV SOLN
7.0000 mL | Freq: Once | INTRAVENOUS | Status: AC | PRN
  Administered 2021-10-12: 7 mL via INTRAVENOUS

## 2021-10-19 ENCOUNTER — Other Ambulatory Visit: Payer: BC Managed Care – PPO

## 2021-10-28 DIAGNOSIS — D352 Benign neoplasm of pituitary gland: Secondary | ICD-10-CM | POA: Diagnosis not present

## 2021-11-08 DIAGNOSIS — E890 Postprocedural hypothyroidism: Secondary | ICD-10-CM | POA: Diagnosis not present

## 2021-11-08 DIAGNOSIS — C770 Secondary and unspecified malignant neoplasm of lymph nodes of head, face and neck: Secondary | ICD-10-CM | POA: Diagnosis not present

## 2021-11-08 DIAGNOSIS — C73 Malignant neoplasm of thyroid gland: Secondary | ICD-10-CM | POA: Diagnosis not present

## 2021-11-08 DIAGNOSIS — D352 Benign neoplasm of pituitary gland: Secondary | ICD-10-CM | POA: Diagnosis not present

## 2021-12-17 DIAGNOSIS — E890 Postprocedural hypothyroidism: Secondary | ICD-10-CM | POA: Diagnosis not present

## 2022-02-04 DIAGNOSIS — N390 Urinary tract infection, site not specified: Secondary | ICD-10-CM | POA: Diagnosis not present

## 2022-02-04 DIAGNOSIS — L739 Follicular disorder, unspecified: Secondary | ICD-10-CM | POA: Diagnosis not present

## 2022-02-04 DIAGNOSIS — N3000 Acute cystitis without hematuria: Secondary | ICD-10-CM | POA: Diagnosis not present

## 2022-02-04 DIAGNOSIS — B3731 Acute candidiasis of vulva and vagina: Secondary | ICD-10-CM | POA: Diagnosis not present

## 2022-02-04 DIAGNOSIS — Z113 Encounter for screening for infections with a predominantly sexual mode of transmission: Secondary | ICD-10-CM | POA: Diagnosis not present

## 2022-02-04 DIAGNOSIS — Z124 Encounter for screening for malignant neoplasm of cervix: Secondary | ICD-10-CM | POA: Diagnosis not present

## 2022-02-04 DIAGNOSIS — N760 Acute vaginitis: Secondary | ICD-10-CM | POA: Diagnosis not present

## 2022-03-10 DIAGNOSIS — Z0000 Encounter for general adult medical examination without abnormal findings: Secondary | ICD-10-CM | POA: Diagnosis not present

## 2022-03-10 DIAGNOSIS — Z114 Encounter for screening for human immunodeficiency virus [HIV]: Secondary | ICD-10-CM | POA: Diagnosis not present

## 2022-03-10 DIAGNOSIS — Z13220 Encounter for screening for lipoid disorders: Secondary | ICD-10-CM | POA: Diagnosis not present

## 2022-03-12 DIAGNOSIS — N6019 Diffuse cystic mastopathy of unspecified breast: Secondary | ICD-10-CM | POA: Diagnosis not present

## 2022-03-12 DIAGNOSIS — I10 Essential (primary) hypertension: Secondary | ICD-10-CM | POA: Diagnosis not present

## 2022-03-12 DIAGNOSIS — H6123 Impacted cerumen, bilateral: Secondary | ICD-10-CM | POA: Diagnosis not present

## 2022-03-12 DIAGNOSIS — D352 Benign neoplasm of pituitary gland: Secondary | ICD-10-CM | POA: Diagnosis not present

## 2022-03-12 DIAGNOSIS — Z0000 Encounter for general adult medical examination without abnormal findings: Secondary | ICD-10-CM | POA: Diagnosis not present

## 2022-03-12 DIAGNOSIS — Z23 Encounter for immunization: Secondary | ICD-10-CM | POA: Diagnosis not present

## 2022-03-26 IMAGING — US US FNA BIOPSY THYROID 1ST LESION
1 series · 13 of 17 positions shown · non-contrast
Comparison: US THYROID 02/01/2020

MEDICATIONS:
2 mL 1% lidocaine

COMPLICATIONS:
None immediate.

INDICATION: Indeterminate thyroid nodule of the right superior thyroid. Request
is made for fine-needle aspiration of indeterminate nodule.

EXAM:
ULTRASOUND GUIDED FINE NEEDLE ASPIRATION OF INDETERMINATE THYROID
NODULE
TECHNIQUE: Informed written consent was obtained from the patient after a
discussion of the risks, benefits and alternatives to treatment.
Questions regarding the procedure were encouraged and answered. A
timeout was performed prior to the initiation of the procedure.

[Series 1: us fna biopsy thyroid 1st lesion · 0.06mm/px · 17 acquisitions, 13 frames shown]
[im 1/17]
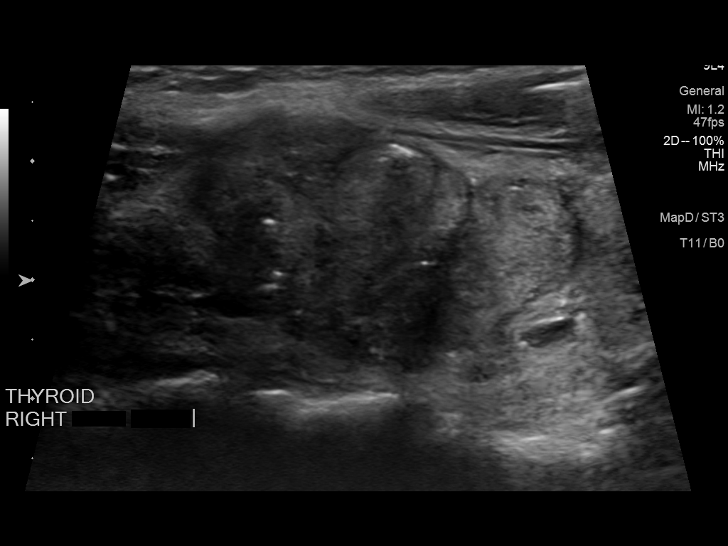
[im 2/17]
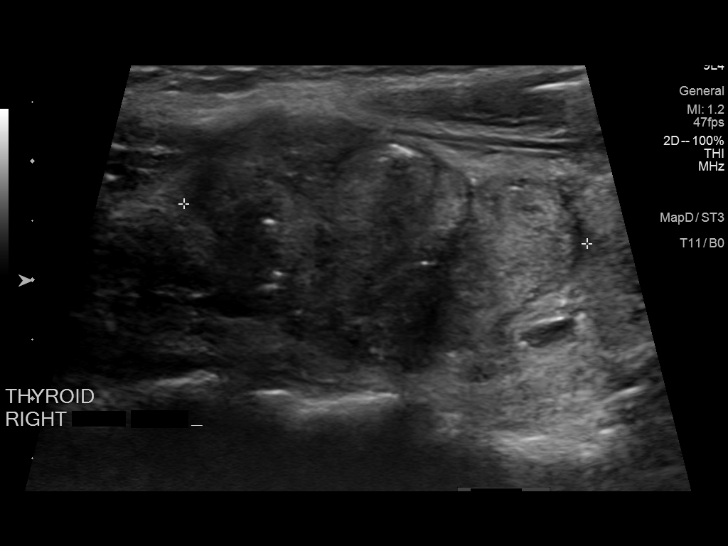
[im 4/17]
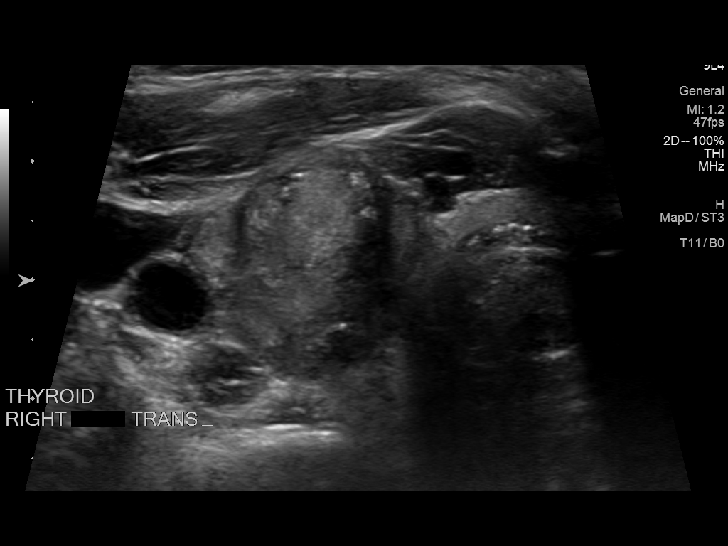
[im 5/17]
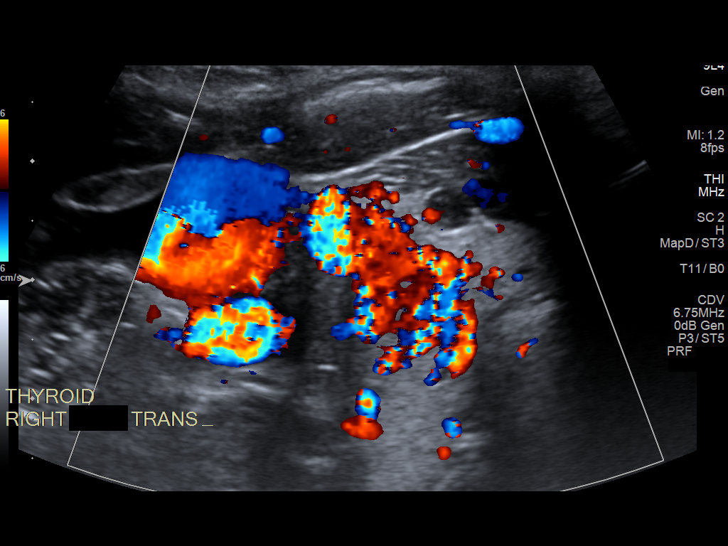
[im 6/17]
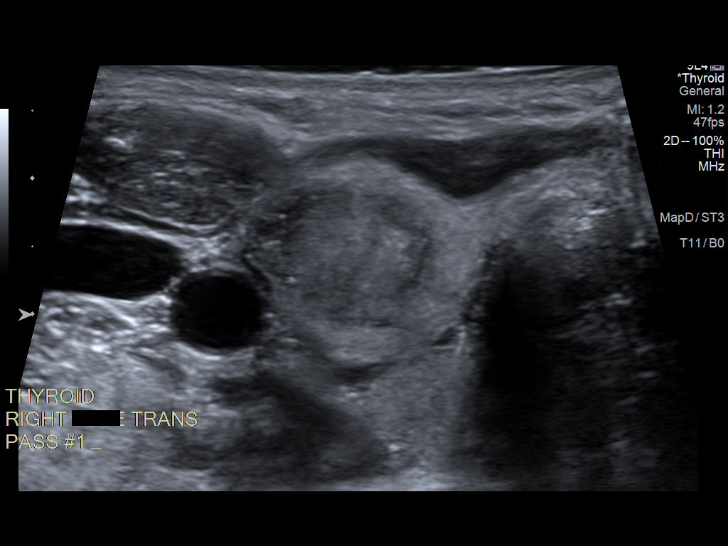
[im 8/17]
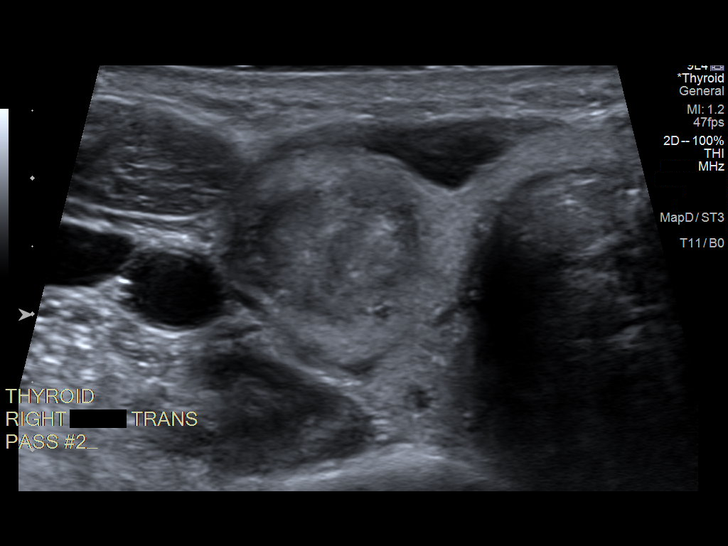
[im 9/17]
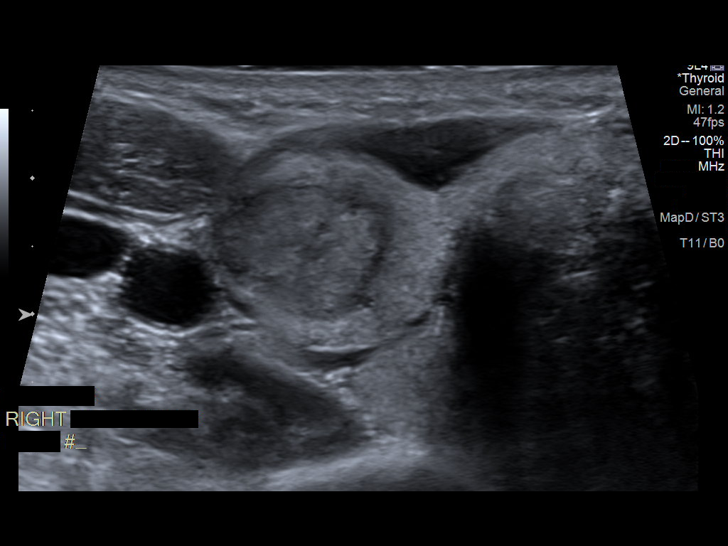
[im 10/17]
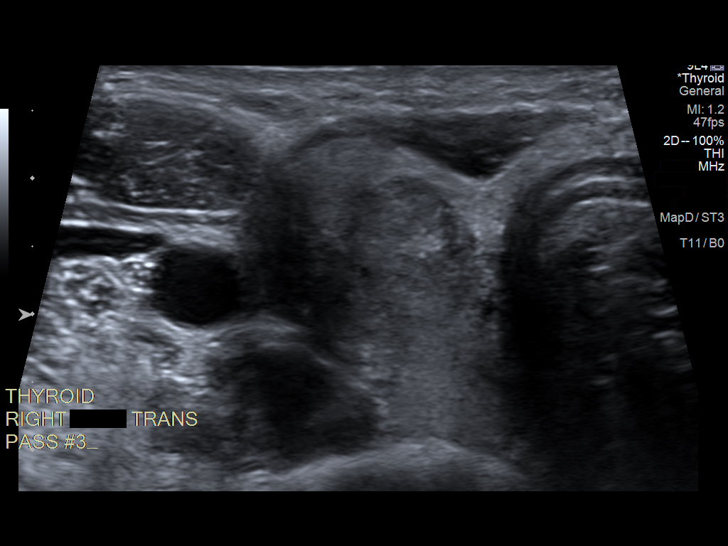
[im 12/17]
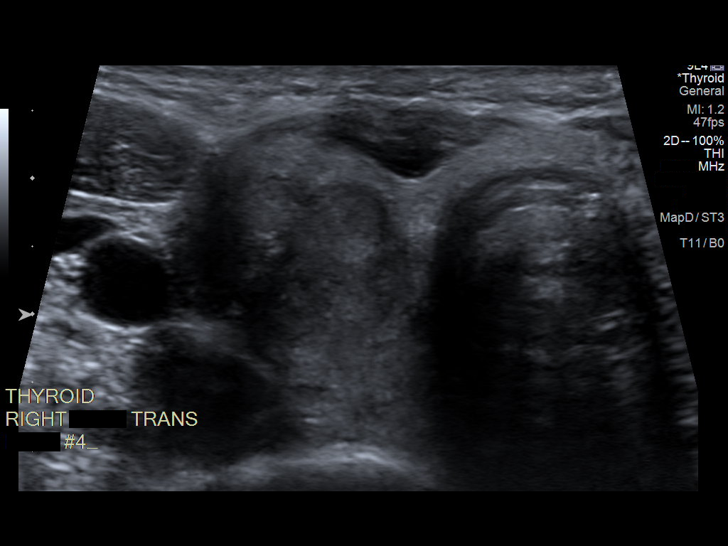
[im 13/17]
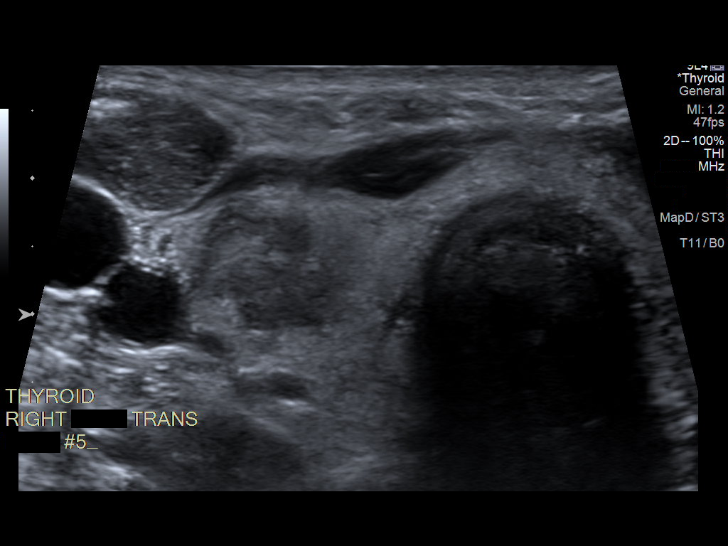
[im 14/17]
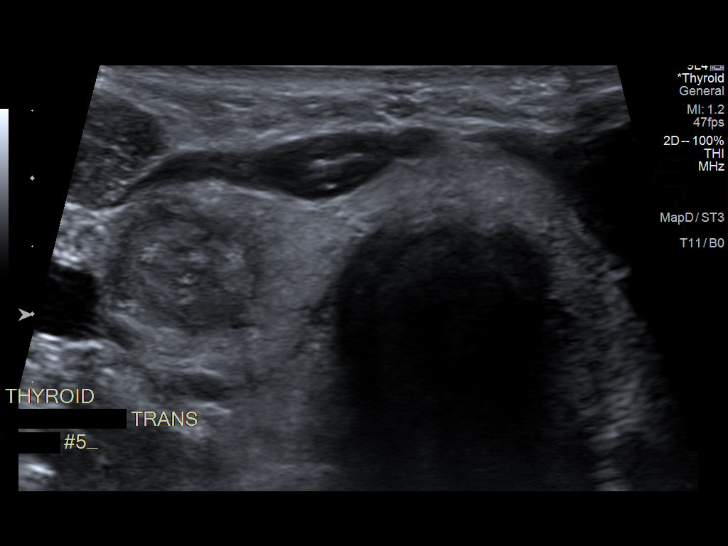
[im 16/17]
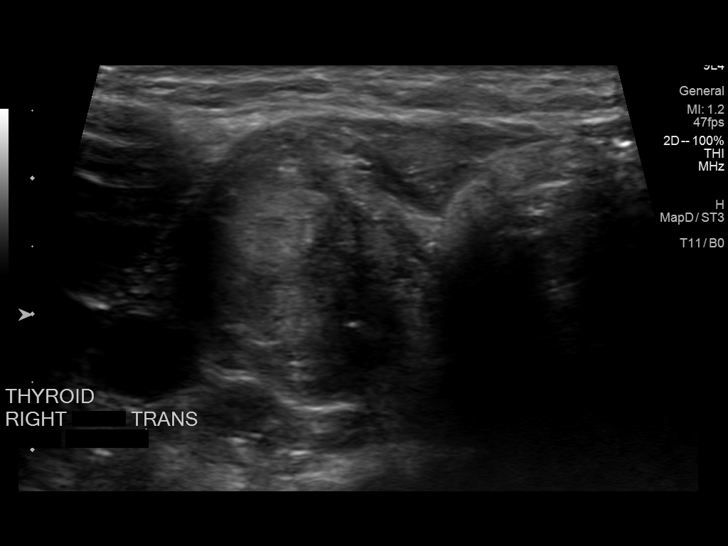
[im 17/17]
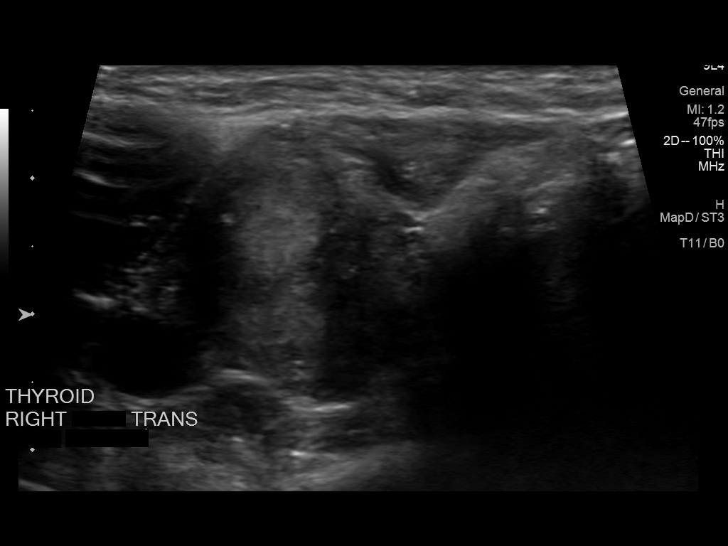

[13 of 17 positions shown; findings below may reference images not displayed]

Pre-procedural ultrasound scanning demonstrated unchanged size and
appearance of the indeterminate nodule within the right superior
thyroid.

The procedure was planned. The neck was prepped in the usual sterile
fashion, and a sterile drape was applied covering the operative
field. A timeout was performed prior to the initiation of the
procedure. Local anesthesia was provided with 1% lidocaine.

Under direct ultrasound guidance, 5 FNA biopsies were performed of
the indeterminate right superior nodule with a 25 gauge needle.
Multiple ultrasound images were saved for procedural documentation
purposes. The samples were prepared and submitted to pathology.
Sample was also prepared for Afirma testing

Limited post procedural scanning was negative for hematoma or
additional complication. Dressings were placed. The patient
tolerated the above procedures procedure well without immediate
postprocedural complication.
FINDINGS: Nodule reference number based on prior diagnostic ultrasound: 1

Maximum size: 3.4 cm

Location: Right; Superior

ACR TI-RADS risk category: TR5 (>/= 7 points)

Reason for biopsy: meets ACR TI-RADS criteria

Ultrasound imaging confirms appropriate placement of the needles
within the thyroid nodule.
IMPRESSION: Technically successful ultrasound guided fine needle aspiration of
right superior thyroid nodule.

## 2022-04-14 DIAGNOSIS — D352 Benign neoplasm of pituitary gland: Secondary | ICD-10-CM | POA: Diagnosis not present

## 2022-04-14 DIAGNOSIS — Z23 Encounter for immunization: Secondary | ICD-10-CM | POA: Diagnosis not present

## 2022-04-21 ENCOUNTER — Other Ambulatory Visit: Payer: Self-pay | Admitting: Neurosurgery

## 2022-04-21 DIAGNOSIS — D352 Benign neoplasm of pituitary gland: Secondary | ICD-10-CM

## 2022-05-23 ENCOUNTER — Encounter: Payer: Self-pay | Admitting: Neurosurgery

## 2022-05-24 ENCOUNTER — Ambulatory Visit
Admission: RE | Admit: 2022-05-24 | Discharge: 2022-05-24 | Disposition: A | Discharge status: Home / Self Care | Payer: BC Managed Care – PPO | Source: Ambulatory Visit | Attending: Neurosurgery | Admitting: Neurosurgery

## 2022-05-24 DIAGNOSIS — C751 Malignant neoplasm of pituitary gland: Secondary | ICD-10-CM | POA: Diagnosis not present

## 2022-05-24 DIAGNOSIS — D352 Benign neoplasm of pituitary gland: Secondary | ICD-10-CM

## 2022-05-24 MED ORDER — GADOPICLENOL 0.5 MMOL/ML IV SOLN
4.0000 mL | Freq: Once | INTRAVENOUS | Status: AC | PRN
  Administered 2022-05-24: 4 mL via INTRAVENOUS

## 2022-05-30 DIAGNOSIS — D352 Benign neoplasm of pituitary gland: Secondary | ICD-10-CM | POA: Diagnosis not present

## 2022-06-03 DIAGNOSIS — C73 Malignant neoplasm of thyroid gland: Secondary | ICD-10-CM | POA: Diagnosis not present

## 2022-06-03 DIAGNOSIS — D352 Benign neoplasm of pituitary gland: Secondary | ICD-10-CM | POA: Diagnosis not present

## 2022-06-03 DIAGNOSIS — E890 Postprocedural hypothyroidism: Secondary | ICD-10-CM | POA: Diagnosis not present

## 2022-06-03 DIAGNOSIS — E221 Hyperprolactinemia: Secondary | ICD-10-CM | POA: Diagnosis not present

## 2022-06-10 DIAGNOSIS — D352 Benign neoplasm of pituitary gland: Secondary | ICD-10-CM | POA: Diagnosis not present

## 2022-06-10 DIAGNOSIS — J342 Deviated nasal septum: Secondary | ICD-10-CM | POA: Diagnosis not present

## 2022-06-10 DIAGNOSIS — J3489 Other specified disorders of nose and nasal sinuses: Secondary | ICD-10-CM | POA: Diagnosis not present

## 2022-06-16 DIAGNOSIS — D352 Benign neoplasm of pituitary gland: Secondary | ICD-10-CM | POA: Diagnosis not present

## 2022-06-16 DIAGNOSIS — I10 Essential (primary) hypertension: Secondary | ICD-10-CM | POA: Diagnosis not present

## 2022-06-17 ENCOUNTER — Other Ambulatory Visit: Payer: Self-pay | Admitting: Neurosurgery

## 2022-06-17 ENCOUNTER — Other Ambulatory Visit: Payer: Self-pay | Admitting: Otolaryngology

## 2022-07-24 ENCOUNTER — Other Ambulatory Visit: Payer: Self-pay | Admitting: Neurosurgery

## 2022-08-05 DIAGNOSIS — C73 Malignant neoplasm of thyroid gland: Secondary | ICD-10-CM | POA: Diagnosis not present

## 2022-08-05 DIAGNOSIS — C770 Secondary and unspecified malignant neoplasm of lymph nodes of head, face and neck: Secondary | ICD-10-CM | POA: Diagnosis not present

## 2022-08-05 DIAGNOSIS — E890 Postprocedural hypothyroidism: Secondary | ICD-10-CM | POA: Diagnosis not present

## 2022-08-08 ENCOUNTER — Other Ambulatory Visit: Payer: Self-pay | Admitting: Otolaryngology

## 2022-08-11 NOTE — Pre-Procedure Instructions (Signed)
Surgical Instructions    Your procedure is scheduled on August 20, 2022.  Report to [facility] Main Entrance "A" at 6:30 A.M., then check in with the Admitting office.  Call this number if you have problems the morning of surgery:  [phone]  If you have any questions prior to your surgery date call [phone]: Open Monday-Friday 8am-4pm If you experience any cold or flu symptoms such as cough, fever, chills, shortness of breath, etc. between now and your scheduled surgery, please notify us at the above number.     Remember:  Do not eat or drink after midnight the night before your surgery     Take these medicines the morning of surgery with A SIP OF WATER:  levothyroxine (SYNTHROID)   loratadine (CLARITIN) - may take if needed   As of today, STOP taking any Aspirin (unless otherwise instructed by your surgeon) Aleve, Naproxen, Ibuprofen, Motrin, Advil, Goody's, BC's, all herbal medications, fish oil, and all vitamins.                     Do NOT Smoke (Tobacco/Vaping) for 24 hours prior to your procedure.  If you use a CPAP at night, you may bring your mask/headgear for your overnight stay.   Contacts, glasses, piercing's, hearing aid's, dentures or partials may not be worn into surgery, please bring cases for these belongings.    For patients admitted to the hospital, discharge time will be determined by your treatment team.   Patients discharged the day of surgery will not be allowed to drive home, and someone needs to stay with them for 24 hours.  SURGICAL WAITING ROOM VISITATION Patients having surgery or a procedure may have no more than 2 support people in the waiting area - these visitors may rotate.   Children under the age of 40 must have an adult with them who is not the patient. If the patient needs to stay at the hospital during part of their recovery, the visitor guidelines for inpatient rooms apply. Pre-op nurse will coordinate an appropriate time for 1 support  person to accompany patient in pre-op.  This support person may not rotate.   Please refer to the [facility] website for the visitor guidelines for Inpatients (after your surgery is over and you are in a regular room).    Special instructions:   [place]- Preparing For Surgery  Before surgery, you can play an important role. Because skin is not sterile, your skin needs to be as free of germs as possible. You can reduce the number of germs on your skin by washing with CHG (chlorahexidine gluconate) Soap before surgery.  CHG is an antiseptic cleaner which kills germs and bonds with the skin to continue killing germs even after washing.    Oral Hygiene is also important to reduce your risk of infection.  Remember - BRUSH YOUR TEETH THE MORNING OF SURGERY WITH YOUR REGULAR TOOTHPASTE  Please do not use if you have an allergy to CHG or antibacterial soaps. If your skin becomes reddened/irritated stop using the CHG.  Do not shave (including legs and underarms) for at least 48 hours prior to first CHG shower. It is OK to shave your face.  Please follow these instructions carefully.   Shower the NIGHT BEFORE SURGERY and the MORNING OF SURGERY  If you chose to wash your hair, wash your hair first as usual with your normal shampoo.  After you shampoo, rinse your hair and body thoroughly to remove  the shampoo.  Use CHG Soap as you would any other liquid soap. You can apply CHG directly to the skin and wash gently with a scrungie or a clean washcloth.   Apply the CHG Soap to your body ONLY FROM THE NECK DOWN.  Do not use on open wounds or open sores. Avoid contact with your eyes, ears, mouth and genitals (private parts). Wash Face and genitals (private parts)  with your normal soap.   Wash thoroughly, paying special attention to the area where your surgery will be performed.  Thoroughly rinse your body with warm water from the neck down.  DO NOT shower/wash with your normal soap after using and  rinsing off the CHG Soap.  Pat yourself dry with a CLEAN TOWEL.  Wear CLEAN PAJAMAS to bed the night before surgery  Place CLEAN SHEETS on your bed the night before your surgery  DO NOT SLEEP WITH PETS.   Day of Surgery: Take a shower with CHG soap. Do not wear jewelry or makeup Do not wear lotions, powders, perfumes/colognes, or deodorant. Do not shave 48 hours prior to surgery.  Men may shave face and neck. Do not bring valuables to the hospital.  [facility] is not responsible for any belongings or valuables. Do not wear nail polish, gel polish, artificial nails, or any other type of covering on natural nails (fingers and toes) If you have artificial nails or gel coating that need to be removed by a nail salon, please have this removed prior to surgery. Artificial nails or gel coating may interfere with anesthesia's ability to adequately monitor your vital signs.  Wear Clean/Comfortable clothing the morning of surgery Remember to brush your teeth WITH YOUR REGULAR TOOTHPASTE.   Please read over the following fact sheets that you were given.    If you received a COVID test during your pre-op visit  it is requested that you wear a mask when out in public, stay away from anyone that may not be feeling well and notify your surgeon if you develop symptoms. If you have been in contact with anyone that has tested positive in the last 10 days please notify you surgeon.

## 2022-08-12 ENCOUNTER — Other Ambulatory Visit: Payer: Self-pay

## 2022-08-12 ENCOUNTER — Encounter (HOSPITAL_COMMUNITY)
Admission: RE | Admit: 2022-08-12 | Discharge: 2022-08-12 | Disposition: A | Discharge status: Home / Self Care | Payer: BC Managed Care – PPO | Source: Ambulatory Visit | Attending: Neurosurgery | Admitting: Neurosurgery

## 2022-08-12 ENCOUNTER — Encounter (HOSPITAL_COMMUNITY): Payer: Self-pay

## 2022-08-12 VITALS — BP 135/93 | HR 94 | Temp 98.2°F | Resp 18 | Ht 67.0 in | Wt 148.0 lb

## 2022-08-12 DIAGNOSIS — Z01818 Encounter for other preprocedural examination: Secondary | ICD-10-CM | POA: Diagnosis not present

## 2022-08-12 DIAGNOSIS — I2510 Atherosclerotic heart disease of native coronary artery without angina pectoris: Secondary | ICD-10-CM | POA: Diagnosis not present

## 2022-08-12 HISTORY — DX: Unspecified asthma, uncomplicated: J45.909

## 2022-08-12 HISTORY — DX: Cardiac arrhythmia, unspecified: I49.9

## 2022-08-12 LAB — BASIC METABOLIC PANEL
Anion gap: 10 (ref 5–15)
BUN: 8 mg/dL (ref 6–20)
CO2: 27 mmol/L (ref 22–32)
Calcium: 9.1 mg/dL (ref 8.9–10.3)
Chloride: 101 mmol/L (ref 98–111)
Creatinine, Ser: 0.66 mg/dL (ref 0.44–1.00)
GFR, Estimated: >60 mL/min (ref >60)
Glucose, Bld: 88 mg/dL (ref 70–99)
Potassium: 3.7 mmol/L (ref 3.5–5.1)
Sodium: 138 mmol/L (ref 135–145)

## 2022-08-12 LAB — CBC
HCT: 36.1 % (ref 36.0–46.0)
Hemoglobin: 11.3 g/dL — ABNORMAL LOW (ref 12.0–15.0)
MCH: 25.1 pg — ABNORMAL LOW (ref 26.0–34.0)
MCHC: 31.3 g/dL (ref 30.0–36.0)
MCV: 80.2 fL (ref 80.0–100.0)
Platelets: 280 10*3/uL (ref 150–400)
RBC: 4.5 MIL/uL (ref 3.87–5.11)
RDW: 15 % (ref 11.5–15.5)
WBC: 5.2 10*3/uL (ref 4.0–10.5)
nRBC: 0 % (ref 0.0–0.2)

## 2022-08-12 LAB — TYPE AND SCREEN
ABO/RH(D): B POS
Antibody Screen: NEGATIVE

## 2022-08-12 NOTE — Progress Notes (Signed)
PCP - Dr. Rachell Cipro Cardiologist - Saw one many years ago in [place]. Has not needed to see a cardiologist since then.  PPM/ICD - Denies Device Orders - n/a Rep Notified - n/a  Chest x-ray - n/a EKG - 08/12/2022 Stress Test - 10/03/2014 in [place] ECHO - Per pt, 10/03/2014 in [place] Cardiac Cath - n/a  Sleep Study - Denies CPAP - n/a  No DM  Last dose of GLP1 agonist- n/a GLP1 instructions: n/a  Blood Thinner Instructions: n/a Aspirin Instructions: n/a  NPO after midnight  COVID TEST- n/a   Anesthesia review:  Yes. Last office note requested from PCP. Pt has known LBBB. Verified with EKG at pre-op visit. Discussed with Karoline Caldwell, PA-C  Patient denies shortness of breath, fever, cough and chest pain at PAT appointment.Pt denies any respiratory illness/infection in the last two months.   All instructions explained to the patient, with a verbal understanding of the material. Patient agrees to go over the instructions while at home for a better understanding. Patient also instructed to self quarantine after being tested for COVID-19. The opportunity to ask questions was provided.

## 2022-08-13 NOTE — Anesthesia Preprocedure Evaluation (Addendum)
Anesthesia Evaluation  Patient identified by MRN, date of birth, ID band Patient awake    Reviewed: Allergy & Precautions, NPO status , Patient's Chart, lab work & pertinent test results  History of Anesthesia Complications Negative for: history of anesthetic complications  Airway Mallampati: I  TM Distance: >3 FB Neck ROM: Full    Dental  (+) Dental Advisory Given, Teeth Intact   Pulmonary neg pulmonary ROS   breath sounds clear to auscultation       Cardiovascular hypertension,  Rhythm:Regular Rate:Normal  Chronic LBBB '16 ECHO; EF 60-65%, normal LVF, trace MR, trace TR   Neuro/Psych   Anxiety     Pituitary adenoma    GI/Hepatic negative GI ROS,,,  Endo/Other  Hypothyroidism    Renal/GU negative Renal ROS     Musculoskeletal   Abdominal   Peds  Hematology negative hematology ROS (+)   Anesthesia Other Findings   Reproductive/Obstetrics                              Anesthesia Physical Anesthesia Plan  ASA: 2  Anesthesia Plan: General   Post-op Pain Management: Tylenol PO (pre-op)*   Induction: Intravenous  PONV Risk Score and Plan: 3 and Ondansetron, Dexamethasone and Scopolamine patch - Pre-op  Airway Management Planned: Oral ETT  Additional Equipment: Arterial line  Intra-op Plan:   Post-operative Plan: Extubation in OR  Informed Consent: I have reviewed the patients History and Physical, chart, labs and discussed the procedure including the risks, benefits and alternatives for the proposed anesthesia with the patient or authorized representative who has indicated his/her understanding and acceptance.     Dental advisory given  Plan Discussed with: CRNA and Surgeon  Anesthesia Plan Comments: (PAT note by Karoline Caldwell, PA-C: History of chronic left bundle branch block with prior negative cardiology workup in 2016 in [place].  Echo at that time showed normal EF 65%,  no significant valve abnormalities.  Exercise tolerance test was negative for chest pain, negative for arrhythmia.  S/p total thyroidectomy 03/19/20.  Preop labs reviewed, mild anemia with Hgb 11.3, otherwise unremarkable.   Exercise tolerance test 10/04/14 ([place], [place], [place]): - Patient exercised for 6 minutes and 0 seconds on Bruce protocol; achieved 7.1 METS at 91% of maximum predicted HR) - Chest pain is not present. No arrhythmias noted on ECG at rest.  - Peak ECG demonstrated sinus tachycardia and developed LBBB   Echo 10/03/14 ([place], [place], [place]):  - LV cavity normal size. Normal global wall motion. Visual EF is 60-65%.  - Structurally normal MV with trace regurgitation - Structurally normal TV with trace regurgitation   )         Anesthesia Quick Evaluation

## 2022-08-13 NOTE — Progress Notes (Signed)
Anesthesia Chart Review:  History of chronic left bundle branch block with prior negative cardiology workup in 2016 in [place].  Echo at that time showed normal EF 65%, no significant valve abnormalities.  Exercise tolerance test was negative for chest pain, negative for arrhythmia.  S/p total thyroidectomy 03/19/20.  Preop labs reviewed, mild anemia with Hgb 11.3, otherwise unremarkable.   Exercise tolerance test 10/04/14 ([place], [place], [place]): - Patient exercised for 6 minutes and 0 seconds on Bruce protocol; achieved 7.1 METS at 91% of maximum predicted HR) - Chest pain is not present. No arrhythmias noted on ECG at rest.  - Peak ECG demonstrated sinus tachycardia and developed LBBB   Echo 10/03/14 ([place], [place], [place]):  - LV cavity normal size. Normal global wall motion. Visual EF is 60-65%.  - Structurally normal MV with trace regurgitation - Structurally normal TV with trace regurgitation     Wynonia Musty [facility] Short Stay Center/Anesthesiology Phone [phone] 08/13/2022 3:11 PM

## 2022-08-20 ENCOUNTER — Inpatient Hospital Stay (HOSPITAL_COMMUNITY): Payer: BC Managed Care – PPO | Admitting: Certified Registered Nurse Anesthetist

## 2022-08-20 ENCOUNTER — Inpatient Hospital Stay (HOSPITAL_COMMUNITY)
Admission: RE | Disposition: A | Discharge status: Home / Self Care | Payer: Self-pay | Source: Ambulatory Visit | Attending: Neurosurgery

## 2022-08-20 ENCOUNTER — Inpatient Hospital Stay (HOSPITAL_COMMUNITY): Payer: BC Managed Care – PPO

## 2022-08-20 ENCOUNTER — Inpatient Hospital Stay (HOSPITAL_COMMUNITY): Payer: BC Managed Care – PPO | Admitting: Physician Assistant

## 2022-08-20 ENCOUNTER — Encounter (HOSPITAL_COMMUNITY): Payer: Self-pay | Admitting: *Deleted

## 2022-08-20 ENCOUNTER — Inpatient Hospital Stay (HOSPITAL_COMMUNITY)
Admission: RE | Admit: 2022-08-20 | Discharge: 2022-08-22 | DRG: 615 | Disposition: A | Discharge status: Home / Self Care | Payer: BC Managed Care – PPO | Source: Ambulatory Visit | Attending: Neurosurgery | Admitting: Neurosurgery

## 2022-08-20 ENCOUNTER — Other Ambulatory Visit: Payer: Self-pay

## 2022-08-20 DIAGNOSIS — D497 Neoplasm of unspecified behavior of endocrine glands and other parts of nervous system: Secondary | ICD-10-CM | POA: Diagnosis not present

## 2022-08-20 DIAGNOSIS — J342 Deviated nasal septum: Secondary | ICD-10-CM | POA: Diagnosis not present

## 2022-08-20 DIAGNOSIS — J45909 Unspecified asthma, uncomplicated: Secondary | ICD-10-CM | POA: Diagnosis not present

## 2022-08-20 DIAGNOSIS — F419 Anxiety disorder, unspecified: Secondary | ICD-10-CM | POA: Diagnosis not present

## 2022-08-20 DIAGNOSIS — D352 Benign neoplasm of pituitary gland: Secondary | ICD-10-CM | POA: Diagnosis not present

## 2022-08-20 DIAGNOSIS — J3489 Other specified disorders of nose and nasal sinuses: Secondary | ICD-10-CM | POA: Diagnosis not present

## 2022-08-20 DIAGNOSIS — E890 Postprocedural hypothyroidism: Secondary | ICD-10-CM | POA: Diagnosis present

## 2022-08-20 DIAGNOSIS — Z79890 Hormone replacement therapy (postmenopausal): Secondary | ICD-10-CM | POA: Diagnosis not present

## 2022-08-20 DIAGNOSIS — Z85850 Personal history of malignant neoplasm of thyroid: Secondary | ICD-10-CM | POA: Diagnosis not present

## 2022-08-20 DIAGNOSIS — I10 Essential (primary) hypertension: Secondary | ICD-10-CM | POA: Diagnosis present

## 2022-08-20 DIAGNOSIS — E893 Postprocedural hypopituitarism: Principal | ICD-10-CM

## 2022-08-20 HISTORY — PX: TRANSPHENOIDAL APPROACH EXPOSURE: SHX6311

## 2022-08-20 HISTORY — PX: ABDOMINAL FAT GRAPH: SHX5709

## 2022-08-20 HISTORY — PX: CRANIOTOMY: SHX93

## 2022-08-20 LAB — URINALYSIS, ROUTINE W REFLEX MICROSCOPIC
Bilirubin Urine: NEGATIVE
Glucose, UA: NEGATIVE mg/dL
Hgb urine dipstick: NEGATIVE
Ketones, ur: 20 mg/dL — AB
Leukocytes,Ua: NEGATIVE
Nitrite: NEGATIVE
Protein, ur: NEGATIVE mg/dL
Specific Gravity, Urine: 1.009 (ref 1.005–1.030)
pH: 6 (ref 5.0–8.0)

## 2022-08-20 LAB — POCT PREGNANCY, URINE: Preg Test, Ur: NEGATIVE

## 2022-08-20 LAB — ABO/RH: ABO/RH(D): B POS

## 2022-08-20 LAB — SODIUM: Sodium: 136 mmol/L (ref 135–145)

## 2022-08-20 SURGERY — CRANIOTOMY HYPOPHYSECTOMY TRANSNASAL APPROACH
Anesthesia: General

## 2022-08-20 MED ORDER — SODIUM CHLORIDE 0.9 % IV SOLN: INTRAVENOUS | Status: DC | PRN

## 2022-08-20 MED ORDER — SODIUM CHLORIDE 0.9 % IR SOLN
Status: DC | PRN
  Administered 2022-08-20: 2000 mL

## 2022-08-20 MED ORDER — HYDROMORPHONE HCL 1 MG/ML IJ SOLN
0.2500 mg | INTRAMUSCULAR | Status: DC | PRN
  Administered 2022-08-20 (×2): 0.5 mg via INTRAVENOUS

## 2022-08-20 MED ORDER — CEFAZOLIN SODIUM-DEXTROSE 2-4 GM/100ML-% IV SOLN
2.0000 g | INTRAVENOUS | Status: AC
  Administered 2022-08-20: 2 g via INTRAVENOUS
  Filled 2022-08-20: qty 100

## 2022-08-20 MED ORDER — SALINE SPRAY 0.65 % NA SOLN
2.0000 | NASAL | Status: DC
  Administered 2022-08-20 – 2022-08-22 (×5): 2 via NASAL
  Filled 2022-08-20: qty 44

## 2022-08-20 MED ORDER — ACETAMINOPHEN 325 MG PO TABS
650.0000 mg | ORAL_TABLET | ORAL | Status: DC | PRN
  Administered 2022-08-20: 650 mg via ORAL
  Filled 2022-08-20: qty 2

## 2022-08-20 MED ORDER — ROCURONIUM BROMIDE 10 MG/ML (PF) SYRINGE
PREFILLED_SYRINGE | INTRAVENOUS | Status: DC | PRN
  Administered 2022-08-20: 60 mg via INTRAVENOUS
  Administered 2022-08-20: 30 mg via INTRAVENOUS
  Administered 2022-08-20: 20 mg via INTRAVENOUS

## 2022-08-20 MED ORDER — LABETALOL HCL 5 MG/ML IV SOLN: 10.0000 mg | INTRAVENOUS | Status: DC | PRN

## 2022-08-20 MED ORDER — NICARDIPINE HCL IN NACL 20-0.86 MG/200ML-% IV SOLN: 3.0000 mg/h | INTRAVENOUS | Status: DC

## 2022-08-20 MED ORDER — LIDOCAINE-EPINEPHRINE 1 %-1:100000 IJ SOLN
INTRAMUSCULAR | Status: DC | PRN
  Administered 2022-08-20: 10 mL
  Administered 2022-08-20: 4 mL

## 2022-08-20 MED ORDER — ENALAPRILAT 1.25 MG/ML IV SOLN: 1.2500 mg | Freq: Four times a day (QID) | INTRAVENOUS | Status: DC | PRN

## 2022-08-20 MED ORDER — ROCURONIUM BROMIDE 10 MG/ML (PF) SYRINGE
PREFILLED_SYRINGE | INTRAVENOUS | Status: AC
  Filled 2022-08-20: qty 10

## 2022-08-20 MED ORDER — ONDANSETRON HCL 4 MG/2ML IJ SOLN
INTRAMUSCULAR | Status: AC
  Filled 2022-08-20: qty 2

## 2022-08-20 MED ORDER — OXYMETAZOLINE HCL 0.05 % NA SOLN: 1.0000 | Freq: Two times a day (BID) | NASAL | Status: DC | PRN

## 2022-08-20 MED ORDER — LORATADINE 10 MG PO TABS: 10.0000 mg | ORAL_TABLET | Freq: Every day | ORAL | Status: DC | PRN

## 2022-08-20 MED ORDER — MORPHINE SULFATE (PF) 2 MG/ML IV SOLN
1.0000 mg | INTRAVENOUS | Status: DC | PRN
  Filled 2022-08-20: qty 1

## 2022-08-20 MED ORDER — SCOPOLAMINE 1 MG/3DAYS TD PT72
MEDICATED_PATCH | TRANSDERMAL | Status: AC
  Filled 2022-08-20: qty 1

## 2022-08-20 MED ORDER — ONDANSETRON HCL 4 MG/2ML IJ SOLN
INTRAMUSCULAR | Status: DC | PRN
  Administered 2022-08-20: 4 mg via INTRAVENOUS

## 2022-08-20 MED ORDER — OXYCODONE HCL 5 MG/5ML PO SOLN: 5.0000 mg | Freq: Once | ORAL | Status: DC | PRN

## 2022-08-20 MED ORDER — OXYCODONE HCL 5 MG PO TABS: 5.0000 mg | ORAL_TABLET | Freq: Once | ORAL | Status: DC | PRN

## 2022-08-20 MED ORDER — HEMOSTATIC AGENTS (NO CHARGE) OPTIME
TOPICAL | Status: DC | PRN
  Administered 2022-08-20 (×2): 1 via TOPICAL

## 2022-08-20 MED ORDER — PHENYLEPHRINE HCL-NACL 20-0.9 MG/250ML-% IV SOLN
INTRAVENOUS | Status: DC | PRN
  Administered 2022-08-20: 30 ug/min via INTRAVENOUS

## 2022-08-20 MED ORDER — ARTIFICIAL TEARS OPHTHALMIC OINT
TOPICAL_OINTMENT | OPHTHALMIC | Status: DC | PRN
  Administered 2022-08-20: 1 via OPHTHALMIC

## 2022-08-20 MED ORDER — PROMETHAZINE HCL 25 MG/ML IJ SOLN: 6.2500 mg | INTRAMUSCULAR | Status: DC | PRN

## 2022-08-20 MED ORDER — HYDROMORPHONE HCL 1 MG/ML IJ SOLN
INTRAMUSCULAR | Status: AC
  Administered 2022-08-20: 0.5 mg via INTRAVENOUS
  Filled 2022-08-20: qty 1

## 2022-08-20 MED ORDER — CEFAZOLIN SODIUM-DEXTROSE 2-4 GM/100ML-% IV SOLN: 2.0000 g | INTRAVENOUS | Status: DC

## 2022-08-20 MED ORDER — CHLORHEXIDINE GLUCONATE CLOTH 2 % EX PADS: 6.0000 | MEDICATED_PAD | Freq: Once | CUTANEOUS | Status: DC

## 2022-08-20 MED ORDER — SODIUM CHLORIDE 0.9 % IV SOLN
0.2000 ug/kg/min | INTRAVENOUS | Status: DC
  Administered 2022-08-20: .2 ug/kg/min via INTRAVENOUS
  Filled 2022-08-20 (×4): qty 5000

## 2022-08-20 MED ORDER — LIDOCAINE 2% (20 MG/ML) 5 ML SYRINGE
INTRAMUSCULAR | Status: DC | PRN
  Administered 2022-08-20: 20 mg via INTRAVENOUS

## 2022-08-20 MED ORDER — MIDAZOLAM HCL 2 MG/2ML IJ SOLN: 0.5000 mg | Freq: Once | INTRAMUSCULAR | Status: DC | PRN

## 2022-08-20 MED ORDER — LIDOCAINE-EPINEPHRINE 1 %-1:100000 IJ SOLN
INTRAMUSCULAR | Status: AC
  Filled 2022-08-20: qty 1

## 2022-08-20 MED ORDER — HYDROCODONE-ACETAMINOPHEN 5-325 MG PO TABS
1.0000 | ORAL_TABLET | ORAL | Status: DC | PRN
  Administered 2022-08-20 – 2022-08-22 (×3): 1 via ORAL
  Filled 2022-08-20 (×3): qty 1

## 2022-08-20 MED ORDER — ALBUMIN HUMAN 5 % IV SOLN: INTRAVENOUS | Status: DC | PRN

## 2022-08-20 MED ORDER — LACTATED RINGERS IV SOLN: INTRAVENOUS | Status: DC | PRN

## 2022-08-20 MED ORDER — LIDOCAINE 2% (20 MG/ML) 5 ML SYRINGE
INTRAMUSCULAR | Status: AC
  Filled 2022-08-20: qty 5

## 2022-08-20 MED ORDER — 0.9 % SODIUM CHLORIDE (POUR BTL) OPTIME
TOPICAL | Status: DC | PRN
  Administered 2022-08-20: 500 mL

## 2022-08-20 MED ORDER — ACETAMINOPHEN 650 MG RE SUPP: 650.0000 mg | RECTAL | Status: DC | PRN

## 2022-08-20 MED ORDER — BACITRACIN ZINC 500 UNIT/GM EX OINT
TOPICAL_OINTMENT | CUTANEOUS | Status: AC
  Filled 2022-08-20: qty 28.35

## 2022-08-20 MED ORDER — ORAL CARE MOUTH RINSE: 15.0000 mL | Freq: Once | OROMUCOSAL | Status: AC

## 2022-08-20 MED ORDER — ONDANSETRON HCL 4 MG/2ML IJ SOLN: 4.0000 mg | INTRAMUSCULAR | Status: DC | PRN

## 2022-08-20 MED ORDER — THROMBIN 20000 UNITS EX SOLR
CUTANEOUS | Status: DC | PRN
  Administered 2022-08-20: 20 mL via TOPICAL

## 2022-08-20 MED ORDER — POTASSIUM CHLORIDE IN NACL 20-0.9 MEQ/L-% IV SOLN
INTRAVENOUS | Status: DC
  Filled 2022-08-20 (×2): qty 1000

## 2022-08-20 MED ORDER — EPINEPHRINE HCL (NASAL) 0.1 % NA SOLN
NASAL | Status: AC
  Filled 2022-08-20: qty 30

## 2022-08-20 MED ORDER — CLEVIDIPINE BUTYRATE 0.5 MG/ML IV EMUL
INTRAVENOUS | Status: DC | PRN
  Administered 2022-08-20: 2 mg/h via INTRAVENOUS

## 2022-08-20 MED ORDER — PHENYLEPHRINE 80 MCG/ML (10ML) SYRINGE FOR IV PUSH (FOR BLOOD PRESSURE SUPPORT)
PREFILLED_SYRINGE | INTRAVENOUS | Status: DC | PRN
  Administered 2022-08-20 (×4): 80 ug via INTRAVENOUS

## 2022-08-20 MED ORDER — DEXAMETHASONE SODIUM PHOSPHATE 10 MG/ML IJ SOLN
INTRAMUSCULAR | Status: AC
  Filled 2022-08-20: qty 1

## 2022-08-20 MED ORDER — THROMBIN 20000 UNITS EX SOLR
CUTANEOUS | Status: AC
  Filled 2022-08-20: qty 20000

## 2022-08-20 MED ORDER — PANTOPRAZOLE SODIUM 40 MG PO TBEC
40.0000 mg | DELAYED_RELEASE_TABLET | Freq: Every day | ORAL | Status: DC
  Administered 2022-08-20 – 2022-08-22 (×3): 40 mg via ORAL
  Filled 2022-08-20 (×3): qty 1

## 2022-08-20 MED ORDER — FLEET ENEMA 7-19 GM/118ML RE ENEM: 1.0000 | ENEMA | Freq: Once | RECTAL | Status: DC | PRN

## 2022-08-20 MED ORDER — ACETAMINOPHEN 500 MG PO TABS
1000.0000 mg | ORAL_TABLET | Freq: Once | ORAL | Status: AC
  Administered 2022-08-20: 1000 mg via ORAL
  Filled 2022-08-20: qty 2

## 2022-08-20 MED ORDER — POLYETHYLENE GLYCOL 3350 17 G PO PACK: 17.0000 g | PACK | Freq: Every day | ORAL | Status: DC | PRN

## 2022-08-20 MED ORDER — MEPERIDINE HCL 25 MG/ML IJ SOLN: 6.2500 mg | INTRAMUSCULAR | Status: DC | PRN

## 2022-08-20 MED ORDER — FENTANYL CITRATE (PF) 250 MCG/5ML IJ SOLN
INTRAMUSCULAR | Status: DC | PRN
  Administered 2022-08-20: 150 ug via INTRAVENOUS
  Administered 2022-08-20 (×2): 50 ug via INTRAVENOUS

## 2022-08-20 MED ORDER — FENTANYL CITRATE (PF) 250 MCG/5ML IJ SOLN
INTRAMUSCULAR | Status: AC
  Filled 2022-08-20: qty 5

## 2022-08-20 MED ORDER — THROMBIN 5000 UNITS EX SOLR
CUTANEOUS | Status: AC
  Filled 2022-08-20: qty 5000

## 2022-08-20 MED ORDER — PROMETHAZINE HCL 25 MG PO TABS: 12.5000 mg | ORAL_TABLET | ORAL | Status: DC | PRN

## 2022-08-20 MED ORDER — LACTATED RINGERS IV SOLN: INTRAVENOUS | Status: DC

## 2022-08-20 MED ORDER — MUPIROCIN 2 % EX OINT
TOPICAL_OINTMENT | CUTANEOUS | Status: AC
  Filled 2022-08-20: qty 22

## 2022-08-20 MED ORDER — SUGAMMADEX SODIUM 200 MG/2ML IV SOLN
INTRAVENOUS | Status: DC | PRN
  Administered 2022-08-20: 200 mg via INTRAVENOUS

## 2022-08-20 MED ORDER — ADULT MULTIVITAMIN W/MINERALS CH
1.0000 | ORAL_TABLET | Freq: Every day | ORAL | Status: DC
  Administered 2022-08-21 – 2022-08-22 (×2): 1 via ORAL
  Filled 2022-08-20 (×2): qty 1

## 2022-08-20 MED ORDER — ENOXAPARIN SODIUM 40 MG/0.4ML IJ SOSY
40.0000 mg | PREFILLED_SYRINGE | INTRAMUSCULAR | Status: DC
  Administered 2022-08-21: 40 mg via SUBCUTANEOUS
  Filled 2022-08-20: qty 0.4

## 2022-08-20 MED ORDER — EPINEPHRINE HCL (NASAL) 0.1 % NA SOLN
NASAL | Status: DC | PRN
  Administered 2022-08-20 (×2): 10 mL via NASAL

## 2022-08-20 MED ORDER — SCOPOLAMINE 1 MG/3DAYS TD PT72
MEDICATED_PATCH | TRANSDERMAL | Status: DC | PRN
  Administered 2022-08-20: 1 via TRANSDERMAL

## 2022-08-20 MED ORDER — THROMBIN 5000 UNITS EX SOLR
OROMUCOSAL | Status: DC | PRN
  Administered 2022-08-20: 5 mL via TOPICAL

## 2022-08-20 MED ORDER — CHLORHEXIDINE GLUCONATE 0.12 % MT SOLN
15.0000 mL | Freq: Once | OROMUCOSAL | Status: AC
  Administered 2022-08-20: 15 mL via OROMUCOSAL
  Filled 2022-08-20: qty 15

## 2022-08-20 MED ORDER — PROPOFOL 10 MG/ML IV BOLUS
INTRAVENOUS | Status: DC | PRN
  Administered 2022-08-20: 100 mg via INTRAVENOUS

## 2022-08-20 MED ORDER — LEVOTHYROXINE SODIUM 88 MCG PO TABS
88.0000 ug | ORAL_TABLET | Freq: Every day | ORAL | Status: DC
  Administered 2022-08-21 – 2022-08-22 (×2): 88 ug via ORAL
  Filled 2022-08-20 (×2): qty 1

## 2022-08-20 MED ORDER — ADHERUS DURAL SEALANT
PACK | TOPICAL | Status: DC | PRN
  Administered 2022-08-20: 1 via TOPICAL

## 2022-08-20 MED ORDER — PROPOFOL 10 MG/ML IV BOLUS
INTRAVENOUS | Status: AC
  Filled 2022-08-20: qty 20

## 2022-08-20 MED ORDER — MUPIROCIN CALCIUM 2 % EX CREA
TOPICAL_CREAM | CUTANEOUS | Status: DC | PRN
  Administered 2022-08-20: 1 via TOPICAL

## 2022-08-20 MED ORDER — DOCUSATE SODIUM 100 MG PO CAPS
100.0000 mg | ORAL_CAPSULE | Freq: Two times a day (BID) | ORAL | Status: DC
  Administered 2022-08-20 – 2022-08-22 (×3): 100 mg via ORAL
  Filled 2022-08-20 (×4): qty 1

## 2022-08-20 MED ORDER — CEFAZOLIN SODIUM-DEXTROSE 1-4 GM/50ML-% IV SOLN
1.0000 g | Freq: Three times a day (TID) | INTRAVENOUS | Status: AC
  Administered 2022-08-20 – 2022-08-21 (×3): 1 g via INTRAVENOUS
  Filled 2022-08-20 (×4): qty 50

## 2022-08-20 MED ORDER — ONDANSETRON HCL 4 MG PO TABS: 4.0000 mg | ORAL_TABLET | ORAL | Status: DC | PRN

## 2022-08-20 SURGICAL SUPPLY — 136 items
ANTIFOG SOL W/FOAM PAD STRL (MISCELLANEOUS) ×1
APL SKNCLS STERI-STRIP NONHPOA (GAUZE/BANDAGES/DRESSINGS) ×1
ATTRACTOMAT 16X20 MAGNETIC DRP (DRAPES) ×1 IMPLANT
BAG COUNTER SPONGE SURGICOUNT (BAG) ×3 IMPLANT
BAG SPNG CNTER NS LX DISP (BAG) ×3
BAND INSRT 18 STRL LF DISP RB (MISCELLANEOUS) ×2
BAND RUBBER #18 3X1/16 STRL (MISCELLANEOUS) ×2 IMPLANT
BENZOIN TINCTURE PRP APPL 2/3 (GAUZE/BANDAGES/DRESSINGS) ×1 IMPLANT
BLADE CLIPPER SURG (BLADE) IMPLANT
BLADE RAD40 ROTATE 4M 4 5PK (BLADE) IMPLANT
BLADE ROTATE TRICUT 4X13 M4 (BLADE) ×1 IMPLANT
BLADE SURG 10 STRL SS (BLADE) ×1 IMPLANT
BLADE SURG 11 STRL SS (BLADE) ×2 IMPLANT
BLADE SURG 15 STRL LF DISP TIS (BLADE) ×2 IMPLANT
BLADE SURG 15 STRL SS (BLADE) ×2
BUR DIAMOND 13X5 70D (BURR) IMPLANT
BUR DIAMOND CURV 15X5 15D (BURR) IMPLANT
BUR TAPER CHOANAL ATRESIA 30K (BURR) ×1 IMPLANT
CABLE BIPOLOR RESECTION CORD (MISCELLANEOUS) ×1 IMPLANT
CANISTER SUCT 3000ML PPV (MISCELLANEOUS) ×4 IMPLANT
COAGULATOR SUCT SWTCH 10FR 6 (ELECTROSURGICAL) IMPLANT
COVER BACK TABLE 60X90IN (DRAPES) IMPLANT
COVER MAYO STAND STRL (DRAPES) ×1 IMPLANT
DEFOGGER MIRROR 1QT (MISCELLANEOUS) ×1 IMPLANT
DRAIN SUBARACHNOID (WOUND CARE) ×1 IMPLANT
DRAPE C-ARM 42X72 X-RAY (DRAPES) ×2 IMPLANT
DRAPE HALF SHEET 40X57 (DRAPES) ×2 IMPLANT
DRAPE INCISE IOBAN 66X45 STRL (DRAPES) ×1 IMPLANT
DRAPE LAPAROTOMY 100X72X124 (DRAPES) ×1 IMPLANT
DRAPE MICROSCOPE SLANT 54X150 (MISCELLANEOUS) IMPLANT
DRAPE SURG 17X23 STRL (DRAPES) ×4 IMPLANT
DRESSING NASAL POPE 10X1.5X2.5 (GAUZE/BANDAGES/DRESSINGS) IMPLANT
DRSG NASAL POPE 10X1.5X2.5 (GAUZE/BANDAGES/DRESSINGS)
DRSG OPSITE POSTOP 3X4 (GAUZE/BANDAGES/DRESSINGS) ×1 IMPLANT
DURAPREP 26ML APPLICATOR (WOUND CARE) ×2 IMPLANT
ELECT COATED BLADE 2.86 ST (ELECTRODE) IMPLANT
ELECT NDL TIP 2.8 STRL (NEEDLE) ×1 IMPLANT
ELECT NEEDLE TIP 2.8 STRL (NEEDLE) ×1 IMPLANT
ELECT REM PT RETURN 9FT ADLT (ELECTROSURGICAL) ×3
ELECTRODE NDL INSULATED 6.5 (ELECTROSURGICAL) IMPLANT
ELECTRODE REM PT RTRN 9FT ADLT (ELECTROSURGICAL) ×3 IMPLANT
GAUZE PACKING FOLDED 2  STR (GAUZE/BANDAGES/DRESSINGS) ×1
GAUZE PACKING FOLDED 2 STR (GAUZE/BANDAGES/DRESSINGS) ×1 IMPLANT
GAUZE SPONGE 2X2 8PLY NS (GAUZE/BANDAGES/DRESSINGS) IMPLANT
GAUZE SPONGE 2X2 8PLY STRL LF (GAUZE/BANDAGES/DRESSINGS) ×1 IMPLANT
GAUZE SPONGE 4X4 12PLY STRL (GAUZE/BANDAGES/DRESSINGS) ×1 IMPLANT
GLOVE BIO SURGEON STRL SZ 6.5 (GLOVE) ×1 IMPLANT
GLOVE BIO SURGEON STRL SZ7 (GLOVE) ×4 IMPLANT
GLOVE BIO SURGEON STRL SZ7.5 (GLOVE) IMPLANT
GLOVE BIOGEL PI IND STRL 7.0 (GLOVE) ×2 IMPLANT
GLOVE BIOGEL PI IND STRL 7.5 (GLOVE) ×6 IMPLANT
GLOVE ECLIPSE 7.0 STRL STRAW (GLOVE) ×1 IMPLANT
GLOVE ECLIPSE 7.5 STRL STRAW (GLOVE) ×1 IMPLANT
GLOVE EXAM NITRILE XL STR (GLOVE) IMPLANT
GOWN STRL REUS W/ TWL LRG LVL3 (GOWN DISPOSABLE) ×4 IMPLANT
GOWN STRL REUS W/ TWL XL LVL3 (GOWN DISPOSABLE) ×2 IMPLANT
GOWN STRL REUS W/TWL 2XL LVL3 (GOWN DISPOSABLE) IMPLANT
GOWN STRL REUS W/TWL LRG LVL3 (GOWN DISPOSABLE) ×4
GOWN STRL REUS W/TWL XL LVL3 (GOWN DISPOSABLE) ×2
GRAFT DURAGEN MATRIX 1WX1L (Tissue) IMPLANT
HEMOSTAT ARISTA ABSORB 3G PWDR (HEMOSTASIS) IMPLANT
HEMOSTAT POWDER KIT SURGIFOAM (HEMOSTASIS) ×1 IMPLANT
HEMOSTAT SURGICEL 2X14 (HEMOSTASIS) IMPLANT
IV NS 1000ML (IV SOLUTION) ×2
IV NS 1000ML BAXH (IV SOLUTION) ×2 IMPLANT
KIT BASIN OR (CUSTOM PROCEDURE TRAY) ×3 IMPLANT
KIT DRAIN CSF ACCUDRAIN (MISCELLANEOUS) IMPLANT
KIT TURNOVER KIT B (KITS) ×3 IMPLANT
KNIFE ARACHNOID DISP AM-21-S (BLADE) IMPLANT
NDL HYPO 25GX1X1/2 BEV (NEEDLE) ×2 IMPLANT
NDL HYPO 25X1 1.5 SAFETY (NEEDLE) ×1 IMPLANT
NDL SPNL 18GX3.5 QUINCKE PK (NEEDLE) IMPLANT
NDL SPNL 22GX3.5 QUINCKE BK (NEEDLE) ×1 IMPLANT
NDL SPNL 25GX3.5 QUINCKE BL (NEEDLE) ×1 IMPLANT
NEEDLE HYPO 22GX1.5 SAFETY (NEEDLE) ×1 IMPLANT
NEEDLE HYPO 25GX1X1/2 BEV (NEEDLE) ×2 IMPLANT
NEEDLE HYPO 25X1 1.5 SAFETY (NEEDLE) ×1 IMPLANT
NEEDLE SPNL 18GX3.5 QUINCKE PK (NEEDLE) IMPLANT
NEEDLE SPNL 22GX3.5 QUINCKE BK (NEEDLE) ×1 IMPLANT
NEEDLE SPNL 25GX3.5 QUINCKE BL (NEEDLE) ×1 IMPLANT
NS IRRIG 1000ML POUR BTL (IV SOLUTION) ×3 IMPLANT
PACK LAMINECTOMY NEURO (CUSTOM PROCEDURE TRAY) ×1 IMPLANT
PAD ARMBOARD 7.5X6 YLW CONV (MISCELLANEOUS) ×6 IMPLANT
PATTIES SURGICAL .25X.25 (GAUZE/BANDAGES/DRESSINGS) IMPLANT
PATTIES SURGICAL .5 X.5 (GAUZE/BANDAGES/DRESSINGS) IMPLANT
PATTIES SURGICAL .5 X3 (DISPOSABLE) ×2 IMPLANT
PENCIL BUTTON HOLSTER BLD 10FT (ELECTRODE) ×1 IMPLANT
SEALANT ADHERUS EXTEND TIP (MISCELLANEOUS) IMPLANT
SHEATH ENDOSCRUB 0 DEG (SHEATH) ×1 IMPLANT
SHEATH ENDOSCRUB 30 DEG (SHEATH) IMPLANT
SHEATH ENDOSCRUB 45 DEG (SHEATH) IMPLANT
SOL ANTI FOG 6CC (MISCELLANEOUS) IMPLANT
SOL ELECTROSURG ANTI STICK (MISCELLANEOUS) ×2
SOLUTION ANTFG W/FOAM PAD STRL (MISCELLANEOUS) IMPLANT
SOLUTION ELECTROSURG ANTI STCK (MISCELLANEOUS) ×2 IMPLANT
SPECIMEN JAR SMALL (MISCELLANEOUS) IMPLANT
SPIKE FLUID TRANSFER (MISCELLANEOUS) ×1 IMPLANT
SPLINT NASAL AIRWAY SILICONE (MISCELLANEOUS) IMPLANT
SPLINT NASAL DOYLE BI-VL (GAUZE/BANDAGES/DRESSINGS) IMPLANT
SPLINT NASAL POSISEP X .6X2 (GAUZE/BANDAGES/DRESSINGS) IMPLANT
SPLINT NASAL POSISEP X2 .8X2.3 (GAUZE/BANDAGES/DRESSINGS) IMPLANT
SPONGE SURGIFOAM ABS GEL 100 (HEMOSTASIS) ×1 IMPLANT
SPONGE T-LAP 4X18 ~~LOC~~+RFID (SPONGE) ×1 IMPLANT
STAPLER SKIN PROX WIDE 3.9 (STAPLE) ×1 IMPLANT
STRIP CLOSURE SKIN 1/2X4 (GAUZE/BANDAGES/DRESSINGS) ×1 IMPLANT
SUCTION FRAZIER HANDLE 10FR (MISCELLANEOUS) ×1
SUCTION TUBE FRAZIER 10FR DISP (MISCELLANEOUS) ×1 IMPLANT
SUT BONE WAX W31G (SUTURE) ×1 IMPLANT
SUT CHROMIC 4 0 P 3 18 (SUTURE) IMPLANT
SUT ETHILON 3 0 FSL (SUTURE) IMPLANT
SUT ETHILON 3 0 PS 1 (SUTURE) IMPLANT
SUT ETHILON 6 0 P 1 (SUTURE) IMPLANT
SUT MNCRL AB 4-0 PS2 18 (SUTURE) ×1 IMPLANT
SUT PDS AB 4-0 RB1 27 (SUTURE) IMPLANT
SUT PDS PLUS AB 5-0 RB-1 (SUTURE) IMPLANT
SUT PLAIN 4 0 ~~LOC~~ 1 (SUTURE) IMPLANT
SUT SILK 2 0 SH (SUTURE) IMPLANT
SUT VIC AB 2-0 CP2 18 (SUTURE) ×1 IMPLANT
SUT VIC AB 4-0 P-3 18X BRD (SUTURE) IMPLANT
SUT VIC AB 4-0 P3 18 (SUTURE)
SYR CONTROL 10ML LL (SYRINGE) ×1 IMPLANT
SYR TB 1ML LUER SLIP (SYRINGE) ×2 IMPLANT
TOWEL GREEN STERILE (TOWEL DISPOSABLE) ×2 IMPLANT
TOWEL GREEN STERILE FF (TOWEL DISPOSABLE) ×3 IMPLANT
TRACKER ENT INSTRUMENT (MISCELLANEOUS) ×2 IMPLANT
TRACKER ENT PATIENT (MISCELLANEOUS) ×1 IMPLANT
TRAP SPECIMEN MUCUS 40CC (MISCELLANEOUS) IMPLANT
TRAY ENT MC OR (CUSTOM PROCEDURE TRAY) ×2 IMPLANT
TRAY FOLEY MTR SLVR 14FR STAT (SET/KITS/TRAYS/PACK) IMPLANT
TRAY FOLEY MTR SLVR 16FR STAT (SET/KITS/TRAYS/PACK) ×1 IMPLANT
TUBE CONNECTING 12X1/4 (SUCTIONS) ×1 IMPLANT
TUBE SALEM SUMP 16F (TUBING) IMPLANT
TUBING EXTENTION W/L.L. (IV SETS) IMPLANT
TUBING FEATHERFLOW (TUBING) IMPLANT
TUBING STRAIGHTSHOT EPS 5PK (TUBING) ×1 IMPLANT
WATER STERILE IRR 1000ML POUR (IV SOLUTION) ×2 IMPLANT

## 2022-08-20 NOTE — Anesthesia Procedure Notes (Addendum)
Procedure Name: Intubation Date/Time: 08/20/2022 9:29 AM  Performed by: Harden Mo, CRNAPre-anesthesia Checklist: Patient identified, Emergency Drugs available, Suction available and Patient being monitored Patient Re-evaluated:Patient Re-evaluated prior to induction Oxygen Delivery Method: Circle system utilized Preoxygenation: Pre-oxygenation with 100% oxygen Induction Type: IV induction Ventilation: Mask ventilation without difficulty Laryngoscope Size: Miller and 2 Grade View: Grade I Tube type: Oral Tube size: 7.0 mm Number of attempts: 1 Airway Equipment and Method: Stylet and Oral airway Placement Confirmation: ETT inserted through vocal cords under direct vision, positive ETCO2 and breath sounds checked- equal and bilateral Secured at: 22 cm Tube secured with: Tape Dental Injury: Teeth and Oropharynx as per pre-operative assessment  Comments: Intubation by SRNA.

## 2022-08-20 NOTE — Anesthesia Procedure Notes (Signed)
Arterial Line Insertion Start/End4/07/2022 8:15 AM, 08/20/2022 8:24 AM Performed by: Annye Asa, MD, Harden Mo, CRNA, anesthesiologist  Patient location: Pre-op. Preanesthetic checklist: patient identified, IV checked, risks and benefits discussed, surgical consent, monitors and equipment checked, pre-op evaluation, timeout performed and anesthesia consent Lidocaine 1% used for infiltration Right, radial was placed Catheter size: 20 G Hand hygiene performed , maximum sterile barriers used  and Seldinger technique used Allen's test indicative of satisfactory collateral circulation Attempts: 1 Procedure performed using ultrasound guided technique. Ultrasound Notes:anatomy identified, needle tip was noted to be adjacent to the nerve/plexus identified, no ultrasound evidence of intravascular and/or intraneural injection and image(s) printed for medical record Following insertion, dressing applied and Biopatch. Post procedure assessment: normal  Post procedure complications: second provider assisted. Patient tolerated the procedure well with no immediate complications.

## 2022-08-20 NOTE — Progress Notes (Addendum)
Patient arrived from 4NICU from PACU around 1500.  Per PACU RN to use cuff pressure as a line has whip and neurosurgeon is aware.  Urine output greater than 1000 cc qt 1525.  Neurosurgery paged and updated. Sodium and specific gravity drawn.  RN to continue to monitor.   Sodium and specific gravity paged to attending.  No new orders at this time.

## 2022-08-20 NOTE — Anesthesia Postprocedure Evaluation (Signed)
Anesthesia Post Note  Patient: Mindy [name]  Procedure(s) Performed: ENDOSCOPIC ENDONASAL TRANSPHENOIDAL RESECTION OF PITUITARY TUMOR ABDOMINAL FAT GRAPH TRANSPHENOIDAL APPROACH EXPOSURE septoplasty and resection of left concha bullosa     Patient location during evaluation: PACU Anesthesia Type: General Level of consciousness: patient cooperative, oriented and sedated Pain management: pain level controlled (nose uncomfortable, improving with analgesia) Vital Signs Assessment: post-procedure vital signs reviewed and stable Respiratory status: spontaneous breathing, nonlabored ventilation and respiratory function stable Cardiovascular status: blood pressure returned to baseline and stable Postop Assessment: no apparent nausea or vomiting Anesthetic complications: no   No notable events documented.  Last Vitals:  Vitals:   08/20/22 1412 08/20/22 1445  BP: 108/65 (!) 120/95  Pulse: 87 72  Resp: 12 12  Temp:  36.6 C  SpO2: 98% 98%    Last Pain:  Vitals:   08/20/22 1445  TempSrc: Oral  PainSc: 8                  [name],[name]

## 2022-08-20 NOTE — Op Note (Signed)
OPERATIVE NOTE  Mindy [name] Date/Time of Admission: 08/20/2022  6:13 AM  CSN: [id number] Attending Provider: Vallarie Mare, MD Room/Bed: MCPO/NONE DOB: 06/03/1980 Age: 42 y.o.   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 Item Serial No. Manufacturer Lot No. LRB No. Used Action  GRAFT DURAGEN MATRIX [id number] - [id number] Tissue GRAFT DURAGEN MATRIX [id number]  [employer] [id number] N/A 1 Implanted    Specimens: ID Type Source Tests Collected by Time Destination  1 : pituitary mass for permanent Tissue PATH Other SURGICAL PATHOLOGY Vallarie Mare, MD [id number]     Complications: None  EBL: 75 ML  Condition: stable  Operative Findings:  Right septal deviation with large bony spur, left concha bullosa  Description of Operation: Once operative consent was obtained and the site and surgery were confirmed with the patient and the operating room team, the patient was brought back to the operating room and general endotracheal anesthesia was obtained. The patient was then turned over to the ENT service, at which time the image-guided system was attached and noted to be in good calibration. Lidocaine 1% with 1:100,000 epinephrine was injected into the nasal septum bilaterally, inferior turbinates bilaterally, the middle turbinates bilaterally, and the axilla between the medial turbinate and the lateral nasal  wall. Afrin-soaked pledgets were placed into the nasal cavity, and the patient was prepped and draped in sterile fashion. Attention was first turned to the left nasal vestibule. A left sided hemi-transfixion incision was made a submucoperichondrial flap was elevated on the left.  A submucous resection of  nasal septal cartilage was performed with care taken to leave a 1 cm caudal and dorsal strut. In doing this, a right-sided submucoperichondrial flap was elevated.  The bony nasal septum was addressed by lifting up the soft tissues, separating the superior septum with a double action scissor and then removing the inferior deflected portion. The nasal spine was removed using the Psychologist, educational and Donavan Foil. With this completed, the nasal septum was midline. The submucoperichondrial flaps were returned to their anatomic position and hemi-transfixion incision was closed with interrupted 4-0 chromic gut. A 4-0 plain gut suture was used to perform a mattress style stitch in a circular direction from anterior to posterior septum to re-approximate the right and left sided flaps. With the septum midline, attention was turned to the right nasal vestibule.  The inferior and middle turbinate were gently lateralized using a Cottle elevator until the superior turbinate was identified.  This was also lateralized until the natural sinus ostium was appreciated.  This was serially dilated using a mushroom punch and an up-biting Kerrison.  A posterior septectomy was then performed using a combination of a Cottle elevator, Tru-Cut forceps and Kerrisons. The pedicle for a rescue flap was gently dissected off the bone and preserved. The left middle turbinate and superior turbinate were then identified and the left concha bullosa was resected using a sickle knife, middle turbinate scissors, and the microdebrider. The remainder of the middle turbinate and the superior turbinate were gently lateralized until the natural  sphenoid os was  identified and serially dilated using the sphenoid dilator, mushroom punch and up-biting Kerrisons.  With the bilateral sphenoid cavities in view, the remaining bone of the sphenoid face was removed using Kerrison forceps.  A diamond bur drill was also used to complete the bony resection and to remove the intrasinus septum until the bone overlying the pituitary adenoma was completely visualized.  This bone was removed using Kerrison forceps until the tumor was completely visualized. Dr. Marcello Moores then scrubbed in and completely resected the tumor, please see his operative note for details regarding the repair.  There was no evidence of CSF leak following tumor removal. The defect was closed with Duragen and Adheris was used to completely cover the duragen. At this point, the remainder of the middle turbinate was noted to be largely avulsed and the remaining pedicle was sharply excised using a true cut.  Posisep nasal packing was placed in bilateral nasal cavities in the sphenoid defect.  Doyle nasal splints were placed in the bilateral nasal cavities and sutured to the columella with a 2-0 silk suture and a nasal drip pad was applied. An orogastric tube was placed and the stomach cavity was suctioned to reduce postoperative nausea. The patient was turned over to anesthesia service and was extubated in the operating room and transferred to the PACU in stable condition.   Jason Coop, [facility]  08/20/2022

## 2022-08-20 NOTE — H&P (Signed)
CC: pituitary mass  HPI:     Patient is a 42 y.o. female presents with an enlarging pituitary mass.  She has had headaches but not had visual symptoms, and imaging showed progressive growth of the lesion.    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   Past Medical History:  Diagnosis Date   Anxiety    Hx   Asthma    As a child   Dysrhythmia    BBB   Hypertension    Hx   Left bundle branch block (LBBB) determined by electrocardiography    Thyroid cancer     Past Surgical History:  Procedure Laterality Date   BREAST BIOPSY  2002   CHOLECYSTECTOMY  2010   THYROIDECTOMY N/A 03/19/2020   Procedure: TOTAL THYROIDECTOMY WITH LIMITED CENTRAL COMPARTMENT LYMPH NODE DISSECTION;  Surgeon: Armandina Gemma, MD;  Location: [facility];  Service: General;  Laterality: N/A;    Medications Prior to Admission  Medication Sig Dispense Refill Last Dose   levothyroxine (SYNTHROID) 88 MCG tablet Take 88 mcg by mouth daily before breakfast.   08/20/2022 at 0450   loratadine (CLARITIN) 10 MG tablet Take 10 mg by mouth daily as needed for allergies.   Past Month   Multiple Vitamin (MULTIVITAMIN WITH MINERALS) TABS tablet Take 1 tablet by mouth daily.   Past Week   No Known Allergies  Social History   Tobacco Use   Smoking status: Never   Smokeless tobacco: Never  Substance Use Topics   Alcohol use: No    History reviewed. No pertinent family history.   Review of Systems Pertinent items are noted in HPI.  Objective:   Patient Vitals for the past 8 hrs:  BP Temp Temp src Pulse Resp SpO2 Height Weight  08/20/22 0711 (!) 139/91 98.2 F (36.8 C) Oral 100 20 100 % 5\' 7"  (1.702 m) 68 kg   No intake/output data recorded. No intake/output data recorded.      General : Alert, cooperative, no distress, appears stated age   Head:  Normocephalic/atraumatic    Eyes: PERRL, conjunctiva/corneas clear, EOM's intact. Fundi could not be  visualized Neck: Supple Chest:  Respirations unlabored Chest wall: no tenderness or deformity Heart: Regular rate and rhythm Abdomen: Soft, nontender and nondistended Extremities: warm and well-perfused Skin: normal turgor, color and texture Neurologic:  Alert, oriented x 3.  Eyes open spontaneously. PERRL, EOMI, VFC, no facial droop. V1-3 intact.  No dysarthria, tongue protrusion symmetric.  CNII-XII intact. Normal strength, sensation and reflexes throughout.  No pronator drift, full strength in legs       Data ReviewCBC:  Lab Results  Component Value Date   WBC 5.2 08/12/2022   RBC 4.50 08/12/2022   BMP:  Lab Results  Component Value Date   GLUCOSE 88 08/12/2022   CO2 27 08/12/2022   BUN 8 08/12/2022   CREATININE 0.66 08/12/2022   CALCIUM 9.1 08/12/2022   Radiology review:  See clinic note for details  Assessment:   Active Problems:   * No active hospital problems. *  42 yo F with pituitary mass showing consistent growth and early optic apparatus impingement Plan:   - plan for TSR today - risks, benefits, alternatives, and expected convalescence were discussed

## 2022-08-20 NOTE — Transfer of Care (Signed)
Immediate Anesthesia Transfer of Care Note  Patient: Lauraashley Bonnet  Procedure(s) Performed: ENDOSCOPIC ENDONASAL TRANSPHENOIDAL RESECTION OF PITUITARY TUMOR ABDOMINAL FAT GRAPH TRANSPHENOIDAL APPROACH EXPOSURE septoplasty and resection of left concha bullosa  Patient Location: PACU  Anesthesia Type:General  Level of Consciousness: awake, alert , and oriented  Airway & Oxygen Therapy: Patient Spontanous Breathing and Patient connected to face mask oxygen  Post-op Assessment: Report given to RN, Post -op Vital signs reviewed and stable, and Patient moving all extremities X 4  Post vital signs: Reviewed and stable  Last Vitals:  Vitals Value Taken Time  BP 126/91 08/20/22 1301  Temp    Pulse 98 08/20/22 1310  Resp 10 08/20/22 1310  SpO2 100 % 08/20/22 1310  Vitals shown include unvalidated device data.  Last Pain:  Vitals:   08/20/22 0711  TempSrc: Oral  PainSc:          Complications: No notable events documented.

## 2022-08-20 NOTE — Op Note (Signed)
Procedure(s): ENDOSCOPIC ENDONASAL TRANSPHENOIDAL RESECTION OF PITUITARY TUMOR PLACEMENT OF LUMBAR DRAIN ABDOMINAL FAT GRAPH TRANSPHENOIDAL APPROACH EXPOSURE Procedure Note  Mindy [name] female 42 y.o. 08/20/2022  Procedure(s) and Anesthesia Type: Panel 1:    * ENDOSCOPIC ENDONASAL TRANSPHENOIDAL RESECTION OF PITUITARY TUMOR - General    * PLACEMENT OF LUMBAR DRAIN - General    * ABDOMINAL FAT GRAPH - General  Panel 2:    * TRANSPHENOIDAL APPROACH EXPOSURE - General  Surgeon(s) and Role: Panel 1:    Mindy Mare, MD - Primary Panel 2:    * Mindy [name], Mindy [name], Mindy [name] - Primary   Indications:    Patient is [name] 42 y.o. female presents with an enlarging pituitary mass.  She has had headaches but not had visual symptoms, and imaging showed progressive growth of the lesion.      Surgeon: Mindy [name]   Co-surgeon: Mindy [name], Mindy [name]  Anesthesia: General endotracheal anesthesia   Procedure Detail  ENDOSCOPIC ENDONASAL TRANSPHENOIDAL RESECTION OF PITUITARY TUMOR, PLACEMENT OF LUMBAR DRAIN, ABDOMINAL FAT GRAPH  The patient was brought to the operating room.  General anesthesia was induced and patient was intubated by the anesthesia service.  Appropriate lines and monitors were placed.  Patient was positioned supine and preoperative thin cut CT was reconstructed into 3D image and used to perform surface match registration with the Medtronic navigation system.  This allowed for navigation throughout the case.  The abdomen and face was preprepped and prepped and draped in sterile fashion.  The endoscopic approach to the sphenoid sinus and sphenoidotomy was performed by Dr. Isaias Cowman.  Please see her op note for details.  The sellar floor was eroded and there was additionally tumor eroding through the dura which was incompetent.  Additional sellar floor was removed laterally to the medial aspect of the cavernous sinus and superiorly to the tuberculum.  Neuronavigation was used  to help with orientation and ensure appropriate opening of the sellar floor.  Dura was cut sharply and coagulated.  Grayish tumor was removed with combination of ring curettes and pituitary rongeurs and suction.  Specimen was collected for pathology.  The posterior aspect of the tumor was removed and the partially eroded posterior sella was seen.  Tumor was removed laterally into the wall of the cavernous sinus.  Superiorly, tumor was removed with the use of angled ring curettes.  The diaphragma was identified in the posterior superior aspect of the tumor.  Angled ring curettes were used to remove additional suprasellar tumor anteriorly.   Sagging diaphragma was noted.  Close inspection of the sella revealed no obvious residual tumor.   Meticulous hemostasis was obtained with Floseal.  There was no evidence of CSF leak .  [name] small piece of DuraGen inlay was placed within the folds of the duraand secured with dural spray.  The turbinates were then medialized and the patient was extubated by the anesthesia service.  All counts were correct at the end of surgery.  No complications were noted.   Findings: Pituitary adenoma  Estimated Blood Loss:  100 ml         Drains: none         Total IV Fluids: see anesthesia records  Blood Given: none          Specimens: pituitary tumor         Implants:  Duragen plus        Complications:  * No complications entered in OR log *  Disposition: PACU - hemodynamically stable.         Condition: stable

## 2022-08-20 NOTE — H&P (Signed)
Mindy [name] is an 42 y.o. female.    Chief Complaint:  Pituitary mass  HPI: Patient presents today for planned elective procedure. No new symptoms.   Past Medical History:  Diagnosis Date   Anxiety    Hx   Asthma    As a child   Dysrhythmia    BBB   Hypertension    Hx   Left bundle branch block (LBBB) determined by electrocardiography    Thyroid cancer     Past Surgical History:  Procedure Laterality Date   BREAST BIOPSY  2002   CHOLECYSTECTOMY  2010   THYROIDECTOMY N/A 03/19/2020   Procedure: TOTAL THYROIDECTOMY WITH LIMITED CENTRAL COMPARTMENT LYMPH NODE DISSECTION;  Surgeon: Armandina Gemma, MD;  Location: [facility];  Service: General;  Laterality: N/A;    History reviewed. No pertinent family history.  Social History:  reports that she has never smoked. She has never used smokeless tobacco. She reports that she does not drink alcohol and does not use drugs.  Allergies: No Known Allergies  Medications Prior to Admission  Medication Sig Dispense Refill   levothyroxine (SYNTHROID) 88 MCG tablet Take 88 mcg by mouth daily before breakfast.     loratadine (CLARITIN) 10 MG tablet Take 10 mg by mouth daily as needed for allergies.     Multiple Vitamin (MULTIVITAMIN WITH MINERALS) TABS tablet Take 1 tablet by mouth daily.      Results for orders placed or performed during the hospital encounter of 08/20/22 (from the past 48 hour(s))  Pregnancy, urine POC     Status: None   Collection Time: 08/20/22  7:09 AM  Result Value Ref Range   Preg Test, Ur NEGATIVE NEGATIVE    Comment:        THE SENSITIVITY OF THIS METHODOLOGY IS >24 mIU/mL   ABO/Rh     Status: None (Preliminary result)   Collection Time: 08/20/22  7:55 AM  Result Value Ref Range   ABO/RH(D) PENDING    No results found.  ROS: ROS  Blood pressure (!) 139/91, pulse 100, temperature 98.2 F (36.8 C), temperature source Oral, resp. rate 20, height 5\' 7"  (1.702 m), weight 68 kg, last menstrual period  08/04/2022, SpO2 100 %.  PHYSICAL EXAM: Physical Exam Constitutional:      Appearance: Normal appearance.  Pulmonary:     Effort: Pulmonary effort is normal.  Neurological:     Mental Status: She is alert.  Psychiatric:        Mood and Affect: Mood normal.        Behavior: Behavior normal.     Studies Reviewed: CT   Assessment/Plan Mindy [name] is a 42 yo F with pituitary mass showing consistent growth  - TO OR for transsphenoidal resection of pituitary macroadenoma with possible septoplasty, possible nasal septal flap and resection of left concha bullosa under general anesthesia in coordination with Dr. Marcello Moores. All questions answered.   Mindy [name] A Emilianna Barlowe 08/20/2022, 9:02 AM

## 2022-08-20 NOTE — Discharge Instructions (Addendum)
[facility]US [facility] ([facility]) Post Operative Instructions  Office: [phone]  The Surgery Itself Patients may be sedated for several hours after surgery and may remain sleepy for the better part of the day. Nausea and vomiting are occasionally seen, and usually resolve by the evening of surgery - even without additional medications.  After Surgery  Facial pressure and fullness similar to a sinus infection/headache is normal after surgery. Breathing through your nose is also difficult due to swelling. A humidifier or vaporizer can be used in the bedroom to prevent throat pain with mouth breathing.   Bloody nasal drainage is normal after this surgery for 5-7 days, usually decreasing in volume with each day that passes. Drainage will flow from the front of the nose and down the back of the throat. Make sure you spit out blood drainage that drips down the back of your throat to prevent nausea/vomiting. You will have a nasal drip pad/sling with gauze to catch drainage from the front of your nose. The dressing may need to be changed frequently during the first 24 hours following surgery. In case of profuse nasal bleeding, you may apply ice to the bridge of the nose and pinch the nose just above the tip and hold for 10 minutes; if bleeding continues, contact the doctors office.   Frequent hot showers or saline nasal rinses (NeilMed) will help break up congestion and clear any clot or mucus that builds up within the nose after surgery. This can be started the day after surgery.   It is more comfortable to sleep with extra pillows or in a recliner for the first few days after surgery until the drainage begins to resolve.    Do not blow your nose for 2 weeks after surgery, or until cleared by your surgeon.   Avoid lifting > 10 lbs. and no vigorous exercise for 2 weeks after surgery.   Avoid airplane travel for 2 weeks following sinus surgery; the cabin pressure changes can cause pain and  swelling within the nose/sinuses.   Sense of smell and taste are often diminished for several weeks after surgery. There may be some tenderness or numbness in your upper front teeth, which is normal after surgery. You may express old clot, discolored mucus or very large nasal crusts from your nose for up to 3-4 weeks after surgery; depending on how frequently and how effectively you irrigate your nose with the saltwater spray.   You may have absorbable sutures inside of your nose after surgery that will slowly dissolve in 2-3 weeks. Be careful when clearing crusts from the nose since they may be attached to these sutures.  Medications  Pain medication can be used for pain as prescribed. Pain and pressure in the nose is expected after surgery. As the surgical site heals, pain will resolve over the course of a week. Pain medications can cause nausea, which can be prevented if you take them with food or milk.   You may be given an antibiotic for one week after surgery to prevent infection. Take this medication with food to prevent nausea or vomiting.   You can use 2 nasal sprays after surgery: Afrin can be used up to 2 times a day for up to 5 days after surgery (best before bed) to reduce bloody drainage from the nose for the first few days after surgery. Saline/salt water spray should be used at least 4-6 times per day, starting the day after surgery to prevent crusting inside of the nose.  Take all of your routine medications as prescribed, unless told otherwise by your surgeon. Any medications that thin the blood should be avoided. This includes aspirin. Avoid aspirin-like products for the first 72 hours after surgery (Advil, Motrin, Excedrin, Alieve, Celebrex, Naprosyn), but you may use them as needed for pain after 72 hours.

## 2022-08-21 ENCOUNTER — Encounter (HOSPITAL_COMMUNITY): Payer: Self-pay | Admitting: Neurosurgery

## 2022-08-21 LAB — CBC WITH DIFFERENTIAL/PLATELET
Abs Immature Granulocytes: 0.03 10*3/uL (ref 0.00–0.07)
Basophils Absolute: 0 10*3/uL (ref 0.0–0.1)
Basophils Relative: 0 %
Eosinophils Absolute: 0 10*3/uL (ref 0.0–0.5)
Eosinophils Relative: 0 %
HCT: 34.7 % — ABNORMAL LOW (ref 36.0–46.0)
Hemoglobin: 10.8 g/dL — ABNORMAL LOW (ref 12.0–15.0)
Immature Granulocytes: 0 %
Lymphocytes Relative: 12 %
Lymphs Abs: 1.2 10*3/uL (ref 0.7–4.0)
MCH: 24.6 pg — ABNORMAL LOW (ref 26.0–34.0)
MCHC: 31.1 g/dL (ref 30.0–36.0)
MCV: 79 fL — ABNORMAL LOW (ref 80.0–100.0)
Monocytes Absolute: 0.7 10*3/uL (ref 0.1–1.0)
Monocytes Relative: 7 %
Neutro Abs: 7.7 10*3/uL (ref 1.7–7.7)
Neutrophils Relative %: 81 %
Platelets: 245 10*3/uL (ref 150–400)
RBC: 4.39 MIL/uL (ref 3.87–5.11)
RDW: 14.8 % (ref 11.5–15.5)
WBC: 9.6 10*3/uL (ref 4.0–10.5)
nRBC: 0 % (ref 0.0–0.2)

## 2022-08-21 LAB — CORTISOL: Cortisol, Plasma: 18.3 ug/dL

## 2022-08-21 LAB — URINALYSIS, ROUTINE W REFLEX MICROSCOPIC
Bilirubin Urine: NEGATIVE
Glucose, UA: NEGATIVE mg/dL
Hgb urine dipstick: NEGATIVE
Ketones, ur: 80 mg/dL — AB
Leukocytes,Ua: NEGATIVE
Nitrite: NEGATIVE
Protein, ur: NEGATIVE mg/dL
Specific Gravity, Urine: 1.019 (ref 1.005–1.030)
pH: 5 (ref 5.0–8.0)

## 2022-08-21 LAB — BASIC METABOLIC PANEL
Anion gap: 14 (ref 5–15)
BUN: 5 mg/dL — ABNORMAL LOW (ref 6–20)
CO2: 21 mmol/L — ABNORMAL LOW (ref 22–32)
Calcium: 8.8 mg/dL — ABNORMAL LOW (ref 8.9–10.3)
Chloride: 105 mmol/L (ref 98–111)
Creatinine, Ser: 0.67 mg/dL (ref 0.44–1.00)
GFR, Estimated: >60 mL/min (ref >60)
Glucose, Bld: 94 mg/dL (ref 70–99)
Potassium: 3.7 mmol/L (ref 3.5–5.1)
Sodium: 140 mmol/L (ref 135–145)

## 2022-08-21 MED ORDER — CLEVIDIPINE BUTYRATE 0.5 MG/ML IV EMUL
INTRAVENOUS | Status: AC
  Filled 2022-08-21: qty 50

## 2022-08-21 MED ORDER — CHLORHEXIDINE GLUCONATE CLOTH 2 % EX PADS
6.0000 | MEDICATED_PAD | Freq: Every day | CUTANEOUS | Status: DC
  Administered 2022-08-21: 6 via TOPICAL

## 2022-08-21 MED FILL — Thrombin For Soln 5000 Unit: CUTANEOUS | Qty: 5000 | Status: AC

## 2022-08-21 NOTE — Progress Notes (Signed)
Subjective: Patient reports minimal headache.  She has been thirsty  Objective: Vital signs in last 24 hours: Temp:  [97.7 F (36.5 C)-98.9 F (37.2 C)] 98.5 F (36.9 C) (04/04 0800) Pulse Rate:  [59-103] 73 (04/04 0900) Resp:  [8-24] 16 (04/04 0900) BP: (93-127)/(57-95) 117/78 (04/04 0900) SpO2:  [94 %-100 %] 100 % (04/04 0900) Arterial Line BP: (69-164)/(61-88) 76/73 (04/04 0800)  Intake/Output from previous day: 04/03 0701 - 04/04 0700 In: 3790.3 [I.V.:3340.4; IV Piggyback:450] Out: 2795 [Urine:2645; Blood:150] Intake/Output this shift: Total I/O In: 199.5 [I.V.:199.5] Out: -   Awake, alert, Ox3 Drip pad with dried blood, no leaking Grossly intact visual fields  Lab Results: No results for input(s): "WBC", "HGB", "HCT", "PLT" in the last 72 hours. BMET Recent Labs    08/20/22 1536  NA 136    Studies/Results: No results found.  Assessment/Plan: 42 yo F s/p TSR POD#1 - awaiting am labs - as 8 am cortisol was not drawn, will also order for tomorrow - d/c foley, continue strict I/Os   Mindy [name] 08/21/2022, 10:48 AM

## 2022-08-21 NOTE — Progress Notes (Signed)
  Transition of Care [facility]) Screening Note   Patient Details  Name: Mindy [name] Date of Birth: Jan 21, 1981   [facility]) CM/SW Contact:    Ella Bodo, RN Phone Number: 08/21/2022, 5:16 PM    Transition of Care Department [facility]) has reviewed patient and no TOC needs have been identified at this time. We will continue to monitor patient advancement through interdisciplinary progression rounds. If new patient transition needs arise, please place a TOC consult.  Reinaldo Raddle, RN, BSN  Trauma/Neuro ICU Case Manager [phone]

## 2022-08-22 LAB — CORTISOL: Cortisol, Plasma: 7.8 ug/dL

## 2022-08-22 LAB — SODIUM: Sodium: 140 mmol/L (ref 135–145)

## 2022-08-22 LAB — SURGICAL PATHOLOGY

## 2022-08-22 MED ORDER — HYDROCODONE-ACETAMINOPHEN 5-325 MG PO TABS: 1.0000 | ORAL_TABLET | ORAL | 0 refills | Status: AC | PRN

## 2022-08-22 NOTE — Progress Notes (Signed)
Patient is a lab collect. Yesterday 08/21/22, phlebotomy was notified that despite having an A line, the patient was a lab draw. Phlebotomy was called 2x and notified in person and still failed to collect labs. This morning 08/22/2022, this RN notified phlebotomy on the unit that patient was lab collect and needed morning labs drawn. They still failed to draw her labs and left the unit. This RN called the phlebotomy at [phone] with no answer and finally reached someone on [phone]. This RN explained the situation that the patient needs a cortisol this morning and it will determine if she can be discharged. Currently waiting for phlebotomy to come and collect these labs.

## 2022-08-22 NOTE — Discharge Summary (Signed)
     Patient ID: Mindy [name] MRN: [id number] DOB/AGE: 40/29/1982 40 y.o.  Admit date: 08/20/2022 Discharge date: 08/22/2022  Admission Diagnoses: ENDOSCOPIC ENDONASAL TRANSPHENOIDAL RESECTION OF PITUITARY TUMOR   Discharge Diagnoses:  Same Principal Problem:   Status post transsphenoidal pituitary resection Active Problems:   Pituitary tumor   Discharged Condition: Stable  Hospital Course:  Mindy [name] is a 43 y.o. female who underwent ENDOSCOPIC ENDONASAL TRANSPHENOIDAL RESECTION OF PITUITARY TUMOR on 08/20/22. Procedure was uncomplicated and she was recovered in PACU and then transferred to 4NICU. Initially post op there was concern for pituitary dysfunction with DI given increased urination, however her postop labs including Sodium, and UA SG are wnl, as well as her Cortisol levels. There is no evidence of CSF leak on her physical exam. She is appropriate for discharge today.   Treatments: Surgery - ENDOSCOPIC ENDONASAL TRANSPHENOIDAL RESECTION OF PITUITARY TUMOR   Discharge Exam: Blood pressure (!) 128/90, pulse 66, temperature 98.4 F (36.9 C), temperature source Oral, resp. rate 13, height 5\' 7"  (1.702 m), weight 68 kg, last menstrual period 08/04/2022, SpO2 100 %. Awake, alert, oriented Speech fluent, appropriate CN grossly intact 5/5 BUE/BLE Wound c/d/i  Disposition: Discharge disposition: 01-Home or Self Care        Allergies as of 08/22/2022   No Known Allergies      Medication List     TAKE these medications    HYDROcodone-acetaminophen 5-325 MG tablet Commonly known as: NORCO/VICODIN Take 1 tablet by mouth every 4 (four) hours as needed for up to 7 days for moderate pain.   levothyroxine 88 MCG tablet Commonly known as: SYNTHROID Take 88 mcg by mouth daily before breakfast.   loratadine 10 MG tablet Commonly known as: CLARITIN Take 10 mg by mouth daily as needed for allergies.   multivitamin with minerals Tabs tablet Take 1 tablet by  mouth daily.        Follow-up Information     Skotnicki, Meghan A, DO Follow up on 09/04/2022.   Specialty: Otolaryngology Why: Rinaldo Ratel up as scheduled on 08/28/2022 &  09/04/2022 Contact information: [address] [place] [address] [phone]         Bedelia Person, MD. Go to.   Specialty: Neurosurgery Contact information: [address] [place] [address] [phone]                 Signed: Clovis Riley 08/22/2022, 10:32 AM

## 2022-08-28 DIAGNOSIS — J342 Deviated nasal septum: Secondary | ICD-10-CM | POA: Diagnosis not present

## 2022-08-28 DIAGNOSIS — D352 Benign neoplasm of pituitary gland: Secondary | ICD-10-CM | POA: Diagnosis not present

## 2022-09-04 DIAGNOSIS — J342 Deviated nasal septum: Secondary | ICD-10-CM | POA: Diagnosis not present

## 2022-09-04 DIAGNOSIS — D352 Benign neoplasm of pituitary gland: Secondary | ICD-10-CM | POA: Diagnosis not present

## 2022-09-04 DIAGNOSIS — Z9889 Other specified postprocedural states: Secondary | ICD-10-CM | POA: Diagnosis not present

## 2022-09-04 DIAGNOSIS — J309 Allergic rhinitis, unspecified: Secondary | ICD-10-CM | POA: Diagnosis not present

## 2022-09-17 DIAGNOSIS — Z23 Encounter for immunization: Secondary | ICD-10-CM | POA: Diagnosis not present

## 2022-09-18 ENCOUNTER — Other Ambulatory Visit: Payer: Self-pay | Admitting: Internal Medicine

## 2022-09-18 DIAGNOSIS — E221 Hyperprolactinemia: Secondary | ICD-10-CM | POA: Diagnosis not present

## 2022-09-18 DIAGNOSIS — D352 Benign neoplasm of pituitary gland: Secondary | ICD-10-CM | POA: Diagnosis not present

## 2022-09-18 DIAGNOSIS — C73 Malignant neoplasm of thyroid gland: Secondary | ICD-10-CM

## 2022-09-18 DIAGNOSIS — E890 Postprocedural hypothyroidism: Secondary | ICD-10-CM | POA: Diagnosis not present

## 2022-09-20 ENCOUNTER — Emergency Department (HOSPITAL_COMMUNITY)
Admission: EM | Admit: 2022-09-20 | Discharge: 2022-09-20 | Disposition: A | Discharge status: Home / Self Care | Payer: BC Managed Care – PPO | Attending: Emergency Medicine | Admitting: Emergency Medicine

## 2022-09-20 ENCOUNTER — Encounter (HOSPITAL_COMMUNITY): Payer: Self-pay | Admitting: *Deleted

## 2022-09-20 ENCOUNTER — Other Ambulatory Visit: Payer: Self-pay

## 2022-09-20 ENCOUNTER — Emergency Department (HOSPITAL_COMMUNITY): Payer: BC Managed Care – PPO

## 2022-09-20 DIAGNOSIS — I10 Essential (primary) hypertension: Secondary | ICD-10-CM | POA: Insufficient documentation

## 2022-09-20 DIAGNOSIS — R0789 Other chest pain: Secondary | ICD-10-CM | POA: Insufficient documentation

## 2022-09-20 DIAGNOSIS — J45909 Unspecified asthma, uncomplicated: Secondary | ICD-10-CM | POA: Diagnosis not present

## 2022-09-20 DIAGNOSIS — R0602 Shortness of breath: Secondary | ICD-10-CM | POA: Insufficient documentation

## 2022-09-20 DIAGNOSIS — Z85850 Personal history of malignant neoplasm of thyroid: Secondary | ICD-10-CM | POA: Insufficient documentation

## 2022-09-20 DIAGNOSIS — Z79899 Other long term (current) drug therapy: Secondary | ICD-10-CM | POA: Insufficient documentation

## 2022-09-20 DIAGNOSIS — R000 Tachycardia, unspecified: Secondary | ICD-10-CM | POA: Insufficient documentation

## 2022-09-20 DIAGNOSIS — R0600 Dyspnea, unspecified: Secondary | ICD-10-CM | POA: Diagnosis not present

## 2022-09-20 DIAGNOSIS — R079 Chest pain, unspecified: Secondary | ICD-10-CM | POA: Diagnosis not present

## 2022-09-20 LAB — CBC
HCT: 32.9 % — ABNORMAL LOW (ref 36.0–46.0)
Hemoglobin: 10.1 g/dL — ABNORMAL LOW (ref 12.0–15.0)
MCH: 24.2 pg — ABNORMAL LOW (ref 26.0–34.0)
MCHC: 30.7 g/dL (ref 30.0–36.0)
MCV: 78.9 fL — ABNORMAL LOW (ref 80.0–100.0)
Platelets: 255 10*3/uL (ref 150–400)
RBC: 4.17 MIL/uL (ref 3.87–5.11)
RDW: 14.6 % (ref 11.5–15.5)
WBC: 6.3 10*3/uL (ref 4.0–10.5)
nRBC: 0 % (ref 0.0–0.2)

## 2022-09-20 LAB — BASIC METABOLIC PANEL
Anion gap: 10 (ref 5–15)
BUN: 9 mg/dL (ref 6–20)
CO2: 23 mmol/L (ref 22–32)
Calcium: 8.7 mg/dL — ABNORMAL LOW (ref 8.9–10.3)
Chloride: 103 mmol/L (ref 98–111)
Creatinine, Ser: 0.71 mg/dL (ref 0.44–1.00)
GFR, Estimated: >60 mL/min (ref >60)
Glucose, Bld: 117 mg/dL — ABNORMAL HIGH (ref 70–99)
Potassium: 3.8 mmol/L (ref 3.5–5.1)
Sodium: 136 mmol/L (ref 135–145)

## 2022-09-20 LAB — I-STAT BETA HCG BLOOD, ED (MC, WL, AP ONLY): I-stat hCG, quantitative: <5 m[IU]/mL (ref <5)

## 2022-09-20 LAB — D-DIMER, QUANTITATIVE: D-Dimer, Quant: 0.44 ug/mL-FEU (ref 0.00–0.50)

## 2022-09-20 LAB — TROPONIN I (HIGH SENSITIVITY)
Troponin I (High Sensitivity): <2 ng/L (ref <18)
Troponin I (High Sensitivity): 3 ng/L (ref <18)

## 2022-09-20 NOTE — Discharge Instructions (Addendum)
Thank you for coming to [facility]. You were seen for intermittent chest pain and shortness of breath. We did an exam, labs, and imaging, and these showed no acute findings. You have anemia that is chronic for you; your hemoglobin was 10.1 today. You can alternate taking Tylenol and ibuprofen as needed for pain. You can take 650mg  tylenol (acetaminophen) every 4-6 hours, and 600 mg ibuprofen 3 times a day.   Please follow up with your primary care provider within 1 week.   Do not hesitate to return to the ED or call 911 if you experience: -Worsening symptoms -Lightheadedness, passing out -Fevers/chills -Anything else that concerns you

## 2022-09-20 NOTE — ED Triage Notes (Signed)
The pt is c/o chest pain  and sob for  2 days  she had pituitary surgery last month and she has not felt well since  one time cough no temp  lmp 2 weeks ago

## 2022-09-20 NOTE — ED Provider Notes (Signed)
[place] EMERGENCY DEPARTMENT AT [facility] Provider Note   CSN: [id number] Arrival date & time: 09/20/22  1843     History  Chief Complaint  Patient presents with   Chest Pain    Mindy [name] is a very pleasant 42 y.o. 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   Per chart review patient was admitted from 08/20/2022 to 08/22/2022 for transsphenoidal pituitary resection for pituitary tumor with Dr. Maisie Fus. She reports that she has intermittent "mild" central "squeezing" chest pain intermittently, occurs randomly not necessarily with exertion. Not pleuritic in nature. Lasts a few minutes and then goes away. A/w some SOB that is not exertional, does not seem to be correlated to CP either. No cough, no f/c. Otherwise from surgery she feels she is recovering well, has had some soreness of the face. LMP 2 weeks ago.    Chest Pain      Home Medications Prior to Admission medications   Medication Sig Start Date End Date Taking? Authorizing Provider  levothyroxine (SYNTHROID) 88 MCG tablet Take 88 mcg by mouth daily before breakfast.    [provider]  loratadine (CLARITIN) 10 MG tablet Take 10 mg by mouth daily as needed for allergies.    [provider]  Multiple Vitamin (MULTIVITAMIN WITH MINERALS) TABS tablet Take 1 tablet by mouth daily.    [provider]      Allergies    Patient has no known allergies.    Review of Systems   Review of Systems  Cardiovascular:  Positive for chest pain.   Review of systems Negative for  F/c.  A 10 point review of systems was performed and is negative unless otherwise reported in HPI.  Physical Exam Updated Vital Signs BP (!) 145/105 (BP Location: Right Arm)   Pulse (!) 102   Temp 98.2 F (36.8 C) (Oral)   Resp 18   Ht 5\' 7"  (1.702 m)   Wt 68 kg   LMP 09/06/2022 Comment: per pt,  not pregnant  SpO2 100%   BMI 23.48 kg/m  Physical Exam General: Very wwell-appearing female, lying in bed.  HEENT: PERRLA, EOMI, Sclera anicteric, MMM, trachea midline. Clear oropharynx.  Cardiology: RRR, no murmurs/rubs/gallops. BL radial and DP pulses equal bilaterally.  Resp: Normal respiratory rate and effort. CTAB, no wheezes, rhonchi, crackles.  Abd: Soft, non-tender, non-distended. No rebound tenderness or guarding.  GU: Deferred. MSK: No peripheral edema or signs of trauma. Extremities without deformity or TTP. No cyanosis or clubbing. Skin: warm, dry.  Neuro: A&Ox4, CNs II-XII grossly intact. MAEs. Sensation grossly intact.  Psych: Normal mood and affect.   ED Results / Procedures / Treatments   Labs (all labs ordered are listed, but only abnormal results are displayed) Labs Reviewed  BASIC METABOLIC PANEL - Abnormal; Notable for the following components:      Result Value   Glucose, Bld 117 (*)    Calcium 8.7 (*)    All other components within normal limits  CBC - Abnormal; Notable for the following components:   Hemoglobin 10.1 (*)    HCT 32.9 (*)    MCV 78.9 (*)    MCH 24.2 (*)    All other components within normal limits  I-STAT BETA HCG BLOOD, ED ([facility], [facility], [facility] ONLY)  TROPONIN I (HIGH SENSITIVITY)  TROPONIN I (HIGH SENSITIVITY)    EKG EKG Interpretation  Date/Time:  Saturday Sep 20 2022 18:51:41  EDT Ventricular Rate:  104 PR Interval:  168 QRS Duration: 128 QT Interval:  354 QTC Calculation: 465 R Axis:   39 Text Interpretation: Sinus tachycardia Left bundle branch block  Similar to prior Confirmed by Vivi Barrack [phone]) on 09/20/2022 10:33:40 PM  Radiology DG Chest 2 View  Result Date: 09/20/2022 CLINICAL DATA:  Chest pain, dyspnea EXAM: CHEST - 2 VIEW COMPARISON:  03/07/2020 FINDINGS: Lungs are clear. No pneumothorax or pleural effusion. Cardiac size within normal limits. Pulmonary vascularity is normal. Surgical clips overlie the sternal notch in  keeping with probable thyroidectomy. No acute bone abnormality IMPRESSION: 1. No active cardiopulmonary disease. Electronically Signed   By: Helyn Numbers M.D.   On: 09/20/2022 20:05    Procedures Procedures    Medications Ordered in ED Medications - No data to display  ED Course/ Medical Decision Making/ A&P                          Medical Decision Making Amount and/or Complexity of Data Reviewed Labs:  Decision-making details documented in ED Course. Radiology:  Decision-making details documented in ED Course.    This patient presents to the ED for concern of chest pain and SOB, this involves an extensive number of treatment options, and is a complaint that carries with it a high risk of complications and morbidity.  I considered the following differential and admission for this acute, potentially life threatening condition.   MDM:    DDX for chest pain includes but is not limited to:  ACS/arrhythmia,  PE, aortic dissection, PNA, PTX, esophogeal rupture, biliary disease, pericarditis, GERD/PUD/gastritis, or musculoskeletal pain. Very low suspicion for ACS vs aortic dissection given presenting sx. given her recent hospitalization and surgery she is at higher risk for PE and she does present mildly tachycardic to 102 bpm, will obtain a D-dimer and reassess.  She is not tachypneic, short of breath here on exam, or hypoxic. No LEE or crackles on exam to suggest pulm edema/ CHF.   No abdominal pain and no c/f biliary disease.   Clinical Course as of 10/10/22 1539  Sat Sep 20, 2022  2232 D-Dimer, Quant: 0.44 Neg [HN]  2232 I-stat hCG, quantitative: <5.0 [HN]  2232 Troponin I (High Sensitivity): <2 [HN]  2232 WBC: 6.3 No leukocytosis [HN]  2232 Hemoglobin(!): 10.1 Mildly decreased but last value was 10.8, she denies any bleeding [HN]  2232 Basic metabolic panel(!) Unremarkable in context of presentation [HN]  2233 DG Chest 2 View FINDINGS: Lungs are clear. No pneumothorax or  pleural effusion. Cardiac size within normal limits. Pulmonary vascularity is normal. Surgical clips overlie the sternal notch in keeping with probable thyroidectomy. No acute bone abnormality  IMPRESSION: 1. No active cardiopulmonary disease.   [HN]  2304 Troponin I (High Sensitivity): 3 [HN]    Clinical Course User Index [HN] Loetta Rough, MD    Labs: I Ordered, and personally interpreted labs.  The pertinent results include:  those listed above  Imaging Studies ordered: I ordered imaging studies including CXR I independently visualized and interpreted imaging. I agree with the radiologist interpretation  Additional history obtained from chart review.    Cardiac Monitoring: The patient was maintained on a cardiac monitor.  I personally viewed and interpreted the cardiac monitored which showed an underlying rhythm of: sinus tachycardia and normal sinus rhythm  Reevaluation: After the interventions noted above, I reevaluated the patient and found that they have :improved  Social Determinants of  Health: Patient lives independently   Disposition:  Patient with HEART score 1 for pre-existing LBBB. Patient feels well with no intervention here in ED. W/u is very reassuring. Overall low c/f emergent cause of CP. Dimer negative. Will be DC'd w/ close o/p f/u recommended within 1 week.  DC w/ discharge instructions/return precautions. All questions answered to patient's satisfaction.    Co morbidities that complicate the patient evaluation  Past Medical History:  Diagnosis Date   Anxiety    Hx   Asthma    As a child   Dysrhythmia    BBB   Hypertension    Hx   Left bundle branch block (LBBB) determined by electrocardiography    Thyroid cancer (HCC)      Medicines No orders of the defined types were placed in this encounter.   I have reviewed the patients home medicines and have made adjustments as needed  Problem List / ED Course: Problem List Items Addressed  This Visit   None Visit Diagnoses     Atypical chest pain    -  Primary                   This note was created using dictation software, which may contain spelling or grammatical errors.    Loetta Rough, MD 10/10/22 [phone]

## 2022-09-22 DIAGNOSIS — D649 Anemia, unspecified: Secondary | ICD-10-CM | POA: Diagnosis not present

## 2022-09-22 DIAGNOSIS — I10 Essential (primary) hypertension: Secondary | ICD-10-CM | POA: Diagnosis not present

## 2022-09-22 DIAGNOSIS — I447 Left bundle-branch block, unspecified: Secondary | ICD-10-CM | POA: Diagnosis not present

## 2022-09-26 DIAGNOSIS — E221 Hyperprolactinemia: Secondary | ICD-10-CM | POA: Diagnosis not present

## 2022-09-26 DIAGNOSIS — Z9889 Other specified postprocedural states: Secondary | ICD-10-CM | POA: Diagnosis not present

## 2022-09-26 DIAGNOSIS — E890 Postprocedural hypothyroidism: Secondary | ICD-10-CM | POA: Diagnosis not present

## 2022-09-26 DIAGNOSIS — D352 Benign neoplasm of pituitary gland: Secondary | ICD-10-CM | POA: Diagnosis not present

## 2022-10-20 ENCOUNTER — Ambulatory Visit
Admission: RE | Admit: 2022-10-20 | Discharge: 2022-10-20 | Disposition: A | Discharge status: Home / Self Care | Payer: BC Managed Care – PPO | Source: Ambulatory Visit | Attending: Internal Medicine | Admitting: Internal Medicine

## 2022-10-20 DIAGNOSIS — C73 Malignant neoplasm of thyroid gland: Secondary | ICD-10-CM

## 2022-10-20 DIAGNOSIS — Z85850 Personal history of malignant neoplasm of thyroid: Secondary | ICD-10-CM | POA: Diagnosis not present

## 2022-10-30 DIAGNOSIS — D352 Benign neoplasm of pituitary gland: Secondary | ICD-10-CM | POA: Diagnosis not present

## 2022-10-30 DIAGNOSIS — H5213 Myopia, bilateral: Secondary | ICD-10-CM | POA: Diagnosis not present

## 2022-11-03 DIAGNOSIS — I10 Essential (primary) hypertension: Secondary | ICD-10-CM | POA: Diagnosis not present

## 2022-11-03 DIAGNOSIS — D649 Anemia, unspecified: Secondary | ICD-10-CM | POA: Diagnosis not present

## 2022-11-05 ENCOUNTER — Other Ambulatory Visit: Payer: Self-pay | Admitting: Neurosurgery

## 2022-11-05 DIAGNOSIS — D352 Benign neoplasm of pituitary gland: Secondary | ICD-10-CM

## 2022-11-13 DIAGNOSIS — D509 Iron deficiency anemia, unspecified: Secondary | ICD-10-CM | POA: Diagnosis not present

## 2022-11-27 DIAGNOSIS — E890 Postprocedural hypothyroidism: Secondary | ICD-10-CM | POA: Diagnosis not present

## 2022-12-04 DIAGNOSIS — H9201 Otalgia, right ear: Secondary | ICD-10-CM | POA: Diagnosis not present

## 2022-12-04 DIAGNOSIS — D352 Benign neoplasm of pituitary gland: Secondary | ICD-10-CM | POA: Diagnosis not present

## 2022-12-04 DIAGNOSIS — J309 Allergic rhinitis, unspecified: Secondary | ICD-10-CM | POA: Diagnosis not present

## 2022-12-06 ENCOUNTER — Ambulatory Visit
Admission: RE | Admit: 2022-12-06 | Discharge: 2022-12-06 | Disposition: A | Discharge status: Home / Self Care | Payer: BC Managed Care – PPO | Source: Ambulatory Visit | Attending: Neurosurgery | Admitting: Neurosurgery

## 2022-12-06 DIAGNOSIS — Z9889 Other specified postprocedural states: Secondary | ICD-10-CM | POA: Diagnosis not present

## 2022-12-06 DIAGNOSIS — D352 Benign neoplasm of pituitary gland: Secondary | ICD-10-CM

## 2022-12-06 MED ORDER — GADOPICLENOL 0.5 MMOL/ML IV SOLN
6.0000 mL | Freq: Once | INTRAVENOUS | Status: AC | PRN
  Administered 2022-12-06: 6 mL via INTRAVENOUS

## 2022-12-22 DIAGNOSIS — D352 Benign neoplasm of pituitary gland: Secondary | ICD-10-CM | POA: Diagnosis not present

## 2022-12-24 DIAGNOSIS — D352 Benign neoplasm of pituitary gland: Secondary | ICD-10-CM | POA: Diagnosis not present

## 2022-12-24 DIAGNOSIS — E039 Hypothyroidism, unspecified: Secondary | ICD-10-CM | POA: Diagnosis not present

## 2022-12-24 DIAGNOSIS — K5900 Constipation, unspecified: Secondary | ICD-10-CM | POA: Diagnosis not present

## 2022-12-24 DIAGNOSIS — D509 Iron deficiency anemia, unspecified: Secondary | ICD-10-CM | POA: Diagnosis not present

## 2022-12-24 DIAGNOSIS — I10 Essential (primary) hypertension: Secondary | ICD-10-CM | POA: Diagnosis not present

## 2022-12-24 DIAGNOSIS — D649 Anemia, unspecified: Secondary | ICD-10-CM | POA: Diagnosis not present

## 2022-12-25 DIAGNOSIS — Z1211 Encounter for screening for malignant neoplasm of colon: Secondary | ICD-10-CM | POA: Diagnosis not present

## 2022-12-29 ENCOUNTER — Encounter: Payer: Self-pay | Admitting: Cardiology

## 2022-12-29 ENCOUNTER — Ambulatory Visit: Payer: BC Managed Care – PPO | Admitting: Cardiology

## 2022-12-29 VITALS — BP 99/68 | HR 99 | Resp 16 | Ht 67.0 in | Wt 147.0 lb

## 2022-12-29 DIAGNOSIS — C73 Malignant neoplasm of thyroid gland: Secondary | ICD-10-CM

## 2022-12-29 DIAGNOSIS — I447 Left bundle-branch block, unspecified: Secondary | ICD-10-CM

## 2022-12-29 DIAGNOSIS — D497 Neoplasm of unspecified behavior of endocrine glands and other parts of nervous system: Secondary | ICD-10-CM

## 2022-12-29 NOTE — Progress Notes (Signed)
ID:  Mindy [name], DOB [birth year], MRN [id number]  PCP:  Lewis Moccasin, MD  Cardiologist:  Tessa Lerner, DO, [facility] (established care 12/29/22) Former Cardiology Providers: [facility] in [place]   REASON FOR CONSULT: Left bundle branch block  REQUESTING PHYSICIAN:  Lewis Moccasin, MD [address] [place],  [place] [address]  Chief Complaint  Patient presents with   EVAL for lbbb   New Patient (Initial Visit)    HPI  Mindy [name] is a 42 y.o. African-American female who presents to the clinic for evaluation of LBBB at the request of Lewis Moccasin, MD. Her past medical history and cardiovascular risk factors include: Hx of Hypertension, left bundle branch block, anemia, papillary thyroid carcinoma, pituitary adenoma status post surgery, anxiety.   Patient is referred to the practice for evaluation of a left bundle branch block.  Patient states that she was diagnosed with LBBB back in 2016 while she was in [place].  She had seen cardiology there and had undergone echo and stress test.  It has been at least 8 years since she seen cardiology and therefore referred to the practice by PCP to reestablish care.  Clinically she denies anginal chest pain.  She did have an episode of shortness of breath for which she went to the ED in the recent past and she was noted to have symptomatic anemia due to postoperative blood loss.  She recently underwent surgery for her pituitary adenoma.  Clinically she denies anginal chest pain or heart failure symptoms.  No family history of premature coronary artery disease or sudden cardiac death in first-degree relatives.  But maternal grandmother had CHF secondary to hypertension and died in her 34s.  FUNCTIONAL STATUS: Able to walk 2 miles in 30 minutes-does it at least 4-5 times a week.  CARDIAC DATABASE: EKG: 12/29/2022: Sinus rhythm, 84 bpm, LAE, left bundle branch block, ST-T changes likely secondary to LBBB.   No prior tracings available for comparison.  Echocardiogram: No results found for this or any previous visit from the past 1095 days.   Stress Testing: No results found for this or any previous visit from the past 1095 days.   ALLERGIES: No Known Allergies  MEDICATION LIST PRIOR TO VISIT: Current Meds  Medication Sig   CLARITIN 10 MG tablet Take 10 mg by mouth daily as needed for allergies or rhinitis.   levothyroxine (SYNTHROID) 88 MCG tablet Take 88 mcg by mouth daily before breakfast.   OCEAN NASAL SPRAY 0.65 % nasal spray Place 1 spray into the nose as needed for congestion.     PAST MEDICAL HISTORY: Past Medical History:  Diagnosis Date   Anxiety    Hx   Asthma    As a child   Dysrhythmia    BBB   Hypertension    Hx   Left bundle branch block (LBBB) determined by electrocardiography    Thyroid cancer (HCC)     PAST SURGICAL HISTORY: Past Surgical History:  Procedure Laterality Date   ABDOMINAL FAT GRAPH N/A 08/20/2022   Procedure: ABDOMINAL FAT GRAPH;  Surgeon: Bedelia Person, MD;  Location: [facility] OR;  Service: Neurosurgery;  Laterality: N/A;   BREAST BIOPSY  2002   CHOLECYSTECTOMY  2010   CRANIOTOMY N/A 08/20/2022   Procedure: ENDOSCOPIC ENDONASAL TRANSPHENOIDAL RESECTION OF PITUITARY TUMOR;  Surgeon: Bedelia Person, MD;  Location: [facility] OR;  Service: Neurosurgery;  Laterality: N/A;   THYROIDECTOMY N/A 03/19/2020   Procedure: TOTAL THYROIDECTOMY WITH LIMITED CENTRAL COMPARTMENT LYMPH  NODE DISSECTION;  Surgeon: Darnell Level, MD;  Location: [facility] ORS;  Service: General;  Laterality: N/A;   TRANSPHENOIDAL APPROACH EXPOSURE N/A 08/20/2022   Procedure: TRANSPHENOIDAL APPROACH EXPOSURE septoplasty and resection of left concha bullosa;  Surgeon: Laren Boom, DO;  Location: [facility] OR;  Service: ENT;  Laterality: N/A;    FAMILY HISTORY: The patient family history includes Heart failure in her maternal grandmother; Hypertension in her father, maternal grandfather, maternal  grandmother, and mother.  SOCIAL HISTORY:  The patient  reports that she has never smoked. She has never used smokeless tobacco. She reports that she does not drink alcohol and does not use drugs.  REVIEW OF SYSTEMS: Review of Systems  Cardiovascular:  Negative for chest pain, claudication, dyspnea on exertion, irregular heartbeat, leg swelling, near-syncope, orthopnea, palpitations, paroxysmal nocturnal dyspnea and syncope.  Respiratory:  Negative for shortness of breath.   Hematologic/Lymphatic: Negative for bleeding problem.  Musculoskeletal:  Negative for muscle cramps and myalgias.  Neurological:  Negative for dizziness and light-headedness.    PHYSICAL EXAM:    12/29/2022    2:45 PM 09/20/2022   10:45 PM 09/20/2022   10:35 PM  Vitals with BMI  Height 5\' 7"     Weight 147 lbs    BMI 23.02    Systolic 99 133 137  Diastolic 68 92 93  Pulse 99 81 71    Physical Exam  Constitutional: No distress.  Age appropriate, hemodynamically stable.   Neck: No JVD present.  Cardiovascular: Normal rate, regular rhythm, S1 normal, S2 normal, intact distal pulses and normal pulses. Exam reveals no gallop, no S3 and no S4.  No murmur heard. Pulmonary/Chest: Effort normal and breath sounds normal. No stridor. She has no wheezes. She has no rales.  Abdominal: Soft. Bowel sounds are normal. She exhibits no distension. There is no abdominal tenderness.  Musculoskeletal:        General: No edema.     Cervical back: Neck supple.  Neurological: She is alert and oriented to person, place, and time. She has intact cranial nerves (2-12).  Skin: Skin is warm and moist.    LABORATORY DATA:    Latest Ref Rng & Units 09/20/2022    7:49 PM 08/21/2022   10:18 AM 08/12/2022    4:02 PM  CBC  WBC 4.0 - 10.5 K/uL 6.3  9.6  5.2   Hemoglobin 12.0 - 15.0 g/dL 96.0  45.4  09.8   Hematocrit 36.0 - 46.0 % 32.9  34.7  36.1   Platelets 150 - 400 K/uL 255  245  280        Latest Ref Rng & Units 09/20/2022    7:49  PM 08/22/2022    7:32 AM 08/21/2022   10:18 AM  CMP  Glucose 70 - 99 mg/dL 119   94   BUN 6 - 20 mg/dL 9   5   Creatinine 1.47 - 1.00 mg/dL 8.29   5.62   Sodium 130 - 145 mmol/L 136  140  140   Potassium 3.5 - 5.1 mmol/L 3.8   3.7   Chloride 98 - 111 mmol/L 103   105   CO2 22 - 32 mmol/L 23   21   Calcium 8.9 - 10.3 mg/dL 8.7   8.8     No results found for: "CHOL", "HDL", "LDLCALC", "LDLDIRECT", "TRIG", "CHOLHDL" No components found for: "NTPROBNP" No results for input(s): "PROBNP" in the last 8760 hours. No results for input(s): "TSH" in the last 8760 hours.  BMP Recent Labs    08/12/22 1602 08/20/22 1536 08/21/22 1018 08/22/22 0732 09/20/22 1949  NA 138   < > 140 140 136  K 3.7  --  3.7  --  3.8  CL 101  --  105  --  103  CO2 27  --  21*  --  23  GLUCOSE 88  --  94  --  117*  BUN 8  --  5*  --  9  CREATININE 0.66  --  0.67  --  0.71  CALCIUM 9.1  --  8.8*  --  8.7*  GFRNONAA >60  --  >60  --  >60   < > = values in this interval not displayed.    HEMOGLOBIN A1C No results found for: "HGBA1C", "MPG"  External Labs: Collected: Sep 23, 2022 provided by PCP. Total cholesterol 92, triglycerides 74, LDL 106, HDL 71. Hemoglobin 11.4 TSH 1.58. Sodium 141, potassium 4.1, chloride 103, bicarb 30. BUN 9, creatinine 0.6. eGFR >60. ALT 14, AST 18, alkaline phosphatase 62  IMPRESSION:    ICD-10-CM   1. LBBB (left bundle branch block)  I44.7 EKG 12-Lead    PCV ECHOCARDIOGRAM COMPLETE    2. Papillary thyroid carcinoma (HCC)  C73     3. Pituitary tumor  D49.7        RECOMMENDATIONS: Mossie Wowk is a 42 y.o. African-American female whose past medical history and cardiac risk factors include: Hx of Hypertension, left bundle branch block, anemia, papillary thyroid carcinoma, pituitary adenoma status post surgery, anxiety.   LBBB (left bundle branch block) Diagnosed with left bundle branch block back in 2016 when she was living in [place]. Back in 2016 she had an echo  and stress test with her former cardiologist.  I do not have these records available for review. They are not available in Care Everywhere either.  Will have to request records. Will proceed with a repeat echocardiogram to reevaluate LVEF. No medication changes warranted at this time. Reemphasized the importance of primary prevention with focus on improving her modifiable cardiovascular risk factors such as glycemic control, lipid management, blood pressure control, weight loss.  Outside labs independently reviewed and noted above.  As long as the echocardiogram is within acceptable limits we will see her back in 1 year sooner if needed.  FINAL MEDICATION LIST END OF ENCOUNTER: No orders of the defined types were placed in this encounter.   Medications Discontinued During This Encounter  Medication Reason   HYDROcodone-acetaminophen (NORCO/VICODIN) 5-325 MG tablet    Multiple Vitamins-Minerals (ONE-A-DAY WOMENS PO)    TYLENOL 500 MG tablet      Current Outpatient Medications:    CLARITIN 10 MG tablet, Take 10 mg by mouth daily as needed for allergies or rhinitis., Disp: , Rfl:    levothyroxine (SYNTHROID) 88 MCG tablet, Take 88 mcg by mouth daily before breakfast., Disp: , Rfl:    OCEAN NASAL SPRAY 0.65 % nasal spray, Place 1 spray into the nose as needed for congestion., Disp: , Rfl:    FEROSUL 325 (65 Fe) MG tablet, Take 325 mg by mouth 2 (two) times daily., Disp: , Rfl:   Orders Placed This Encounter  Procedures   EKG 12-Lead   PCV ECHOCARDIOGRAM COMPLETE    There are no Patient Instructions on file for this visit.   --Continue cardiac medications as reconciled in final medication list. --Return in about 1 year (around 12/29/2023). or sooner if needed. --Continue follow-up with your primary care physician regarding  the management of your other chronic comorbid conditions.  Patient's questions and concerns were addressed to her satisfaction. She voices understanding of the  instructions provided during this encounter.   This note was created using a voice recognition software as a result there may be grammatical errors inadvertently enclosed that do not reflect the nature of this encounter. Every attempt is made to correct such errors.  Tessa Lerner, [place], [facility]  Pager:  [phone] Office: [phone]

## 2023-01-01 DIAGNOSIS — D352 Benign neoplasm of pituitary gland: Secondary | ICD-10-CM | POA: Diagnosis not present

## 2023-01-01 DIAGNOSIS — D509 Iron deficiency anemia, unspecified: Secondary | ICD-10-CM | POA: Diagnosis not present

## 2023-01-01 DIAGNOSIS — E039 Hypothyroidism, unspecified: Secondary | ICD-10-CM | POA: Diagnosis not present

## 2023-01-01 DIAGNOSIS — E559 Vitamin D deficiency, unspecified: Secondary | ICD-10-CM | POA: Diagnosis not present

## 2023-01-07 ENCOUNTER — Ambulatory Visit: Payer: BC Managed Care – PPO

## 2023-01-07 DIAGNOSIS — I447 Left bundle-branch block, unspecified: Secondary | ICD-10-CM

## 2023-01-08 ENCOUNTER — Other Ambulatory Visit: Payer: BC Managed Care – PPO

## 2023-01-13 NOTE — Progress Notes (Signed)
Called and spoke to patient she voiced understanding

## 2023-02-16 DIAGNOSIS — Z79899 Other long term (current) drug therapy: Secondary | ICD-10-CM | POA: Diagnosis not present

## 2023-02-16 DIAGNOSIS — Z8739 Personal history of other diseases of the musculoskeletal system and connective tissue: Secondary | ICD-10-CM | POA: Diagnosis not present

## 2023-02-18 DIAGNOSIS — N3000 Acute cystitis without hematuria: Secondary | ICD-10-CM | POA: Diagnosis not present

## 2023-02-18 DIAGNOSIS — N390 Urinary tract infection, site not specified: Secondary | ICD-10-CM | POA: Diagnosis not present

## 2023-02-18 DIAGNOSIS — R079 Chest pain, unspecified: Secondary | ICD-10-CM | POA: Diagnosis not present

## 2023-02-20 DIAGNOSIS — D509 Iron deficiency anemia, unspecified: Secondary | ICD-10-CM | POA: Diagnosis not present

## 2023-02-20 DIAGNOSIS — D649 Anemia, unspecified: Secondary | ICD-10-CM | POA: Diagnosis not present

## 2023-02-20 DIAGNOSIS — R5383 Other fatigue: Secondary | ICD-10-CM | POA: Diagnosis not present

## 2023-02-20 DIAGNOSIS — R079 Chest pain, unspecified: Secondary | ICD-10-CM | POA: Diagnosis not present

## 2023-02-20 DIAGNOSIS — E559 Vitamin D deficiency, unspecified: Secondary | ICD-10-CM | POA: Diagnosis not present

## 2023-02-23 DIAGNOSIS — I447 Left bundle-branch block, unspecified: Secondary | ICD-10-CM | POA: Diagnosis not present

## 2023-02-23 DIAGNOSIS — E611 Iron deficiency: Secondary | ICD-10-CM | POA: Diagnosis not present

## 2023-02-23 DIAGNOSIS — R079 Chest pain, unspecified: Secondary | ICD-10-CM | POA: Diagnosis not present

## 2023-02-23 DIAGNOSIS — N3000 Acute cystitis without hematuria: Secondary | ICD-10-CM | POA: Diagnosis not present

## 2023-02-24 ENCOUNTER — Telehealth: Payer: Self-pay | Admitting: Cardiology

## 2023-02-24 NOTE — Telephone Encounter (Signed)
Pt calling in with complaints of intermittent chest discomfort when lying down and intermittent sob.  She is an established pt of Dr. Odis Hollingshead.   She states her symptoms have been going on for the last week or 2.  Pt states this has happened in the past and it was noted to be related to low iron levels and abnormal TSH levels.   She states she has since then been treated for both, and has been doing well on medication for each.  She states her intermittent chest discomfort only occurs when she is lying down.  She states her sob is intermittent and associates this with NO certain activity.  She states it just comes and goes.   Asked her if her discomfort associates when breathing while lying flat, and she said "not necessarily."  She denies any palpitations, diaphoresis, dizziness, fluttering, pre-syncope or syncope.  She denies N/V when episodes occur.  She also denies any swelling to her lower extremities.  She confirmed with me that she is completely asymptomatic from a cardiac perspective at this time.  She states just given the intermittent complaints for over 2 weeks now, warranted her to call the office to schedule an appt for this.   Pt recently had an echo done back in Aug, and noted normal EF and mild valvular heart disease on report.    Was unable to find a slot with an APP this week at both our [facility] or [employer] location.   Did schedule the pt to come into the office and see Dr. Rosemary Holms DOD on tomorrow 10/9 at 1:30 pm.  Advised her to arrive 15 mins prior to this appt.  Advised her of our office address/location.    ED precautions provided to the pt in the interim, if symptoms were to reoccur or worsen between now and her appt tomorrow.   Advised her to bring any new lab results she might have had done recently for her iron/TSH levels, to the appt.    Pt aware I will send this message to Dr. Odis Hollingshead to make him aware of this plan.  Pt verbalized understanding and agrees with this  plan.

## 2023-02-24 NOTE — Telephone Encounter (Signed)
Prima, Rayner - 02/24/2023 10:25 AM Patwardhan, Anabel Bene, MD [phone])  Sent: Tue February 24, 2023 12:50 PM  To: Loa Socks, LPN         Message  Noted.    Thanks  MJP

## 2023-02-24 NOTE — Telephone Encounter (Signed)
Noted ? ?Thanks ?MJP ? ?

## 2023-02-24 NOTE — Telephone Encounter (Signed)
Pt c/o of Chest Pain: STAT if CP now or developed within 24 hours  1. Are you having CP right now? Not right now but last night   2. Are you experiencing any other symptoms (ex. SOB, nausea, vomiting, sweating)? SOB at times but mainly when laying down.   3. How long have you been experiencing CP? About a week   4. Is your CP continuous or coming and going? Coming and going   5. Have you taken Nitroglycerin? No  ?

## 2023-02-24 NOTE — Telephone Encounter (Signed)
Mindy Lerner, DO  Loa Socks, LPN Caller: Unspecified (Today, 10:25 AM) Rollene Fare for the update.  ST

## 2023-02-24 NOTE — Telephone Encounter (Signed)
Thank you for the update.  ST

## 2023-02-25 ENCOUNTER — Ambulatory Visit: Payer: BC Managed Care – PPO | Attending: Cardiology | Admitting: Cardiology

## 2023-02-25 ENCOUNTER — Encounter: Payer: Self-pay | Admitting: Cardiology

## 2023-02-25 VITALS — BP 126/70 | HR 93 | Ht 67.0 in | Wt 144.8 lb

## 2023-02-25 DIAGNOSIS — R0609 Other forms of dyspnea: Secondary | ICD-10-CM | POA: Diagnosis not present

## 2023-02-25 DIAGNOSIS — R072 Precordial pain: Secondary | ICD-10-CM | POA: Diagnosis not present

## 2023-02-25 NOTE — Patient Instructions (Signed)
Medication Instructions:  Your physician recommends that you continue on your current medications as directed. Please refer to the Current Medication list given to you today.  *If you need a refill on your cardiac medications before your next appointment, please call your pharmacy*   Lab Work: NONE If you have labs (blood work) drawn today and your tests are completely normal, you will receive your results only by: MyChart Message (if you have MyChart) OR A paper copy in the mail If you have any lab test that is abnormal or we need to change your treatment, we will call you to review the results.   Testing/Procedures: NONE   Follow-Up:As needed At [facility], you and your health needs are our priority.  As part of our continuing mission to provide you with exceptional heart care, we have created designated Provider Care Teams.  These Care Teams include your primary Cardiologist (physician) and Advanced Practice Providers (APPs -  Physician Assistants and Nurse Practitioners) who all work together to provide you with the care you need, when you need it.   Provider:   Elder Negus, MD

## 2023-02-25 NOTE — Progress Notes (Signed)
Cardiology Office Note:  .   Date:  02/25/2023  ID:  Mindy [name], DOB Jun 24, 1980, MRN [id number] PCP: Lewis Moccasin, MD  [facility] Providers Cardiologist:  Truett Mainland, MD PCP: Lewis Moccasin, MD  Chief Complaint  Patient presents with   Chest Pain      History of Present Illness: .    Mindy [name] is a 42 y.o. female with prior h/o hypertension, LBBB, anemia, papillary thyroid carcinoma, pituitary adenoma s/p surgery, anxiety.  Patient was seen as a new consultation for left bundle branch block by Dr. Odis Hollingshead in 12/2022.  Hypertension management was recommended.  Echocardiogram showed normal EF, normal diastolic function, mild MR, TR, PI.  Patient made urgent appointment today due to symptoms of chest pain and shortness of breath.  Chest pain occurred while laying down, improved with sitting up, lasting only for few seconds.  Last chest pain episodes were about a week ago.  She walks briskly, 2 miles in 30 minutes.  She denies having any chest pain during her walks.  She had 1 episode of shortness of breath a few days ago, but did not happen again as recently as today when she walked 2 miles.  She denies orthopnea, PND, leg edema.  She questions if her thyroid medication could be causing her symptoms.  For example, her levothyroxine dose was decreased from 112 mcg to 100 mcg yesterday, however symptoms resolved he will prior to this change being made.  Vitals:   02/25/23 1332  BP: 126/70  Pulse: 93  SpO2: 99%     ROS:  Review of Systems  Cardiovascular:  Positive for chest pain (Now resolved). Negative for dyspnea on exertion, leg swelling, palpitations and syncope.  Respiratory:  Positive for shortness of breath (1 off episode).      Studies Reviewed: Marland Kitchen       Labs 02/2023: Unremarkable  Echocardiogram 01/07/2023: Normal LV systolic function with visual EF 60-65%. Left ventricle cavity is normal in size. Normal left ventricular wall  thickness. Normal global wall motion. Normal diastolic filling pattern, normal LAP. Mild (Grade I) mitral regurgitation. Mild tricuspid regurgitation. No evidence of pulmonary hypertension. Mild pulmonic regurgitation. No prior study for comparison.    Physical Exam:   Physical Exam Vitals and nursing note reviewed.  Constitutional:      General: She is not in acute distress. Neck:     Vascular: No JVD.  Cardiovascular:     Rate and Rhythm: Normal rate and regular rhythm.     Heart sounds: Normal heart sounds. No murmur heard. Pulmonary:     Effort: Pulmonary effort is normal.     Breath sounds: Normal breath sounds. No wheezing or rales.  Musculoskeletal:     Right lower leg: No edema.     Left lower leg: No edema.      VISIT DIAGNOSES:   ICD-10-CM   1. Precordial pain  R07.2        ASSESSMENT AND PLAN: .    Mindy [name] is a 42 y.o. female with prior h/o hypertension, LBBB, anemia, papillary thyroid carcinoma, pituitary adenoma s/p surgery, anxiety.  Precordial pain: Short lasting episodes with laying down, nonexertional, now completely resolved.  I did not perform an EKG today, knowing her baseline EKG showing left heart branch block, and absence of symptoms today.  I do not think her symptoms were cardiac in etiology.  Musculoskeletal etiology is most suspected.  No cardiac workup necessary at this time.  Exertional dyspnea: 1 episode,  with no recurrence.  She has good functional capacity, walking 2 miles in 30 minutes without any reproducible and predictable exertional dyspnea symptoms.  Recent echocardiogram in 12/2022 showed normal EF, normal diastolic dysfunction, and only mild MR, TR, PI.  I suspect this episode was also unrelated to any cardiac cause.  If symptoms recur, she will contact us back.  We will see her as needed.   Signed, Elder Negus, MD

## 2023-03-09 DIAGNOSIS — I10 Essential (primary) hypertension: Secondary | ICD-10-CM | POA: Diagnosis not present

## 2023-03-09 DIAGNOSIS — K5900 Constipation, unspecified: Secondary | ICD-10-CM | POA: Diagnosis not present

## 2023-03-09 DIAGNOSIS — E890 Postprocedural hypothyroidism: Secondary | ICD-10-CM | POA: Diagnosis not present

## 2023-03-09 DIAGNOSIS — Z13220 Encounter for screening for lipoid disorders: Secondary | ICD-10-CM | POA: Diagnosis not present

## 2023-03-16 DIAGNOSIS — Z0000 Encounter for general adult medical examination without abnormal findings: Secondary | ICD-10-CM | POA: Diagnosis not present

## 2023-03-16 DIAGNOSIS — N3000 Acute cystitis without hematuria: Secondary | ICD-10-CM | POA: Diagnosis not present

## 2023-03-16 DIAGNOSIS — N390 Urinary tract infection, site not specified: Secondary | ICD-10-CM | POA: Diagnosis not present

## 2023-03-26 DIAGNOSIS — C73 Malignant neoplasm of thyroid gland: Secondary | ICD-10-CM | POA: Diagnosis not present

## 2023-03-26 DIAGNOSIS — C770 Secondary and unspecified malignant neoplasm of lymph nodes of head, face and neck: Secondary | ICD-10-CM | POA: Diagnosis not present

## 2023-03-26 DIAGNOSIS — E221 Hyperprolactinemia: Secondary | ICD-10-CM | POA: Diagnosis not present

## 2023-03-26 DIAGNOSIS — R102 Pelvic and perineal pain: Secondary | ICD-10-CM | POA: Diagnosis not present

## 2023-03-26 DIAGNOSIS — D352 Benign neoplasm of pituitary gland: Secondary | ICD-10-CM | POA: Diagnosis not present

## 2023-03-26 DIAGNOSIS — E890 Postprocedural hypothyroidism: Secondary | ICD-10-CM | POA: Diagnosis not present

## 2023-03-26 DIAGNOSIS — Z9889 Other specified postprocedural states: Secondary | ICD-10-CM | POA: Diagnosis not present

## 2023-03-26 DIAGNOSIS — N926 Irregular menstruation, unspecified: Secondary | ICD-10-CM | POA: Diagnosis not present

## 2023-03-30 ENCOUNTER — Other Ambulatory Visit: Payer: Self-pay | Admitting: Nurse Practitioner

## 2023-03-30 DIAGNOSIS — Z1231 Encounter for screening mammogram for malignant neoplasm of breast: Secondary | ICD-10-CM

## 2023-03-30 DIAGNOSIS — N926 Irregular menstruation, unspecified: Secondary | ICD-10-CM

## 2023-03-30 DIAGNOSIS — R102 Pelvic and perineal pain: Secondary | ICD-10-CM

## 2023-04-03 DIAGNOSIS — H66002 Acute suppurative otitis media without spontaneous rupture of ear drum, left ear: Secondary | ICD-10-CM | POA: Diagnosis not present

## 2023-04-13 ENCOUNTER — Ambulatory Visit
Admission: RE | Admit: 2023-04-13 | Discharge: 2023-04-13 | Disposition: A | Discharge status: Home / Self Care | Payer: BC Managed Care – PPO | Source: Ambulatory Visit | Attending: Nurse Practitioner | Admitting: Nurse Practitioner

## 2023-04-13 DIAGNOSIS — R102 Pelvic and perineal pain: Secondary | ICD-10-CM

## 2023-04-13 DIAGNOSIS — N926 Irregular menstruation, unspecified: Secondary | ICD-10-CM

## 2023-04-27 DIAGNOSIS — H10413 Chronic giant papillary conjunctivitis, bilateral: Secondary | ICD-10-CM | POA: Diagnosis not present

## 2023-04-27 DIAGNOSIS — D352 Benign neoplasm of pituitary gland: Secondary | ICD-10-CM | POA: Diagnosis not present

## 2023-04-30 ENCOUNTER — Ambulatory Visit: Payer: BC Managed Care – PPO

## 2023-04-30 ENCOUNTER — Ambulatory Visit
Admission: RE | Admit: 2023-04-30 | Discharge: 2023-04-30 | Disposition: A | Discharge status: Home / Self Care | Payer: BC Managed Care – PPO | Source: Ambulatory Visit | Attending: Nurse Practitioner | Admitting: Nurse Practitioner

## 2023-04-30 DIAGNOSIS — Z1231 Encounter for screening mammogram for malignant neoplasm of breast: Secondary | ICD-10-CM

## 2023-06-19 DIAGNOSIS — D649 Anemia, unspecified: Secondary | ICD-10-CM | POA: Diagnosis not present

## 2023-06-19 DIAGNOSIS — E559 Vitamin D deficiency, unspecified: Secondary | ICD-10-CM | POA: Diagnosis not present

## 2023-06-19 DIAGNOSIS — R5383 Other fatigue: Secondary | ICD-10-CM | POA: Diagnosis not present

## 2023-07-06 DIAGNOSIS — D509 Iron deficiency anemia, unspecified: Secondary | ICD-10-CM | POA: Diagnosis not present

## 2023-07-06 DIAGNOSIS — D259 Leiomyoma of uterus, unspecified: Secondary | ICD-10-CM | POA: Diagnosis not present

## 2023-07-06 DIAGNOSIS — E559 Vitamin D deficiency, unspecified: Secondary | ICD-10-CM | POA: Diagnosis not present

## 2023-08-06 DIAGNOSIS — R5383 Other fatigue: Secondary | ICD-10-CM | POA: Diagnosis not present

## 2023-08-06 DIAGNOSIS — D259 Leiomyoma of uterus, unspecified: Secondary | ICD-10-CM | POA: Diagnosis not present

## 2023-08-06 DIAGNOSIS — R109 Unspecified abdominal pain: Secondary | ICD-10-CM | POA: Diagnosis not present

## 2023-08-06 DIAGNOSIS — N390 Urinary tract infection, site not specified: Secondary | ICD-10-CM | POA: Diagnosis not present

## 2023-09-24 DIAGNOSIS — D509 Iron deficiency anemia, unspecified: Secondary | ICD-10-CM | POA: Diagnosis not present

## 2023-09-24 DIAGNOSIS — D352 Benign neoplasm of pituitary gland: Secondary | ICD-10-CM | POA: Diagnosis not present

## 2023-09-24 DIAGNOSIS — Z9889 Other specified postprocedural states: Secondary | ICD-10-CM | POA: Diagnosis not present

## 2023-09-24 DIAGNOSIS — C770 Secondary and unspecified malignant neoplasm of lymph nodes of head, face and neck: Secondary | ICD-10-CM | POA: Diagnosis not present

## 2023-09-24 DIAGNOSIS — E221 Hyperprolactinemia: Secondary | ICD-10-CM | POA: Diagnosis not present

## 2023-09-24 DIAGNOSIS — E890 Postprocedural hypothyroidism: Secondary | ICD-10-CM | POA: Diagnosis not present

## 2023-09-24 DIAGNOSIS — C73 Malignant neoplasm of thyroid gland: Secondary | ICD-10-CM | POA: Diagnosis not present

## 2023-09-29 DIAGNOSIS — D219 Benign neoplasm of connective and other soft tissue, unspecified: Secondary | ICD-10-CM | POA: Diagnosis not present

## 2023-11-03 DIAGNOSIS — Z713 Dietary counseling and surveillance: Secondary | ICD-10-CM | POA: Diagnosis not present

## 2023-11-27 IMAGING — MR MR HEAD WO/W CM
13 of 19 series · 35 of 48 positions shown · IV contrast (multihance)
Comparison: 04/27/2021

CLINICAL DATA: Pituitary adenoma follow-up

EXAM:
MRI HEAD WITHOUT AND WITH CONTRAST
TECHNIQUE: Multiplanar, multiecho pulse sequences of the brain and surrounding
structures were obtained without and with intravenous contrast.
CONTRAST:  7mL MULTIHANCE GADOBENATE DIMEGLUMINE 529 MG/ML IV SOLN

[Series 2: T1 · sagittal · 5.0mm · 0.45mm/px · 1 of 22 slices shown (1 of 3)]
[im 1/22]
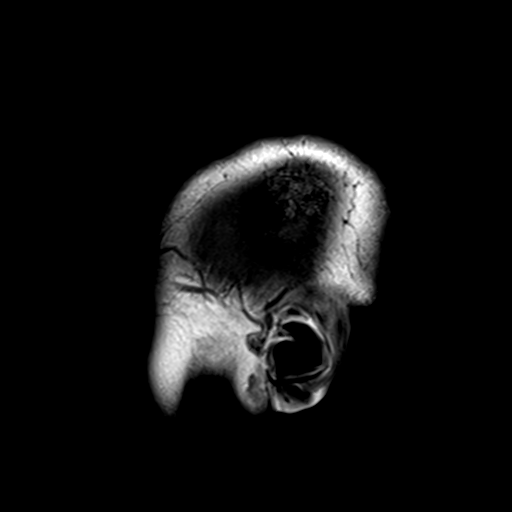

[Series 3: DWI · axial · 3.0mm · 1.80mm/px · z∈[-64,+82]mm · 11 of 100 slices shown]
[im 1/100]
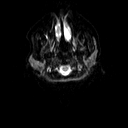
[im 10/100]
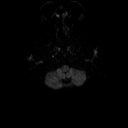
[im 20/100]
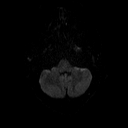
[im 30/100]
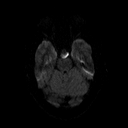
[im 40/100]
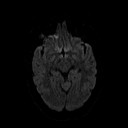
[im 50/100]
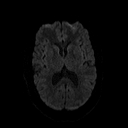
[im 60/100]
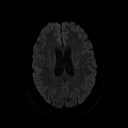
[im 70/100]
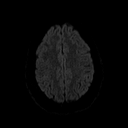
[im 80/100]
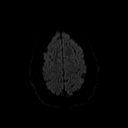
[im 90/100]
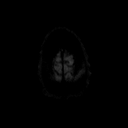
[im 100/100]
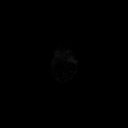

[Series 4: dwi_adc · axial · 3.0mm · 1.80mm/px · z∈[-64,+82]mm · 5 of 50 slices shown]
[im 1/50]
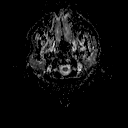
[im 13/50]
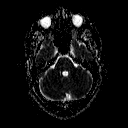
[im 25/50]
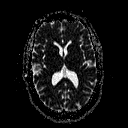
[im 37/50]
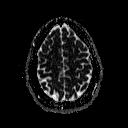
[im 50/50]
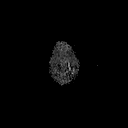

[Series 5: T2 · axial · 5.0mm · 0.36mm/px · z∈[-62,+86]mm · 3 of 24 slices shown (1 of 2)]
[im 1/24]
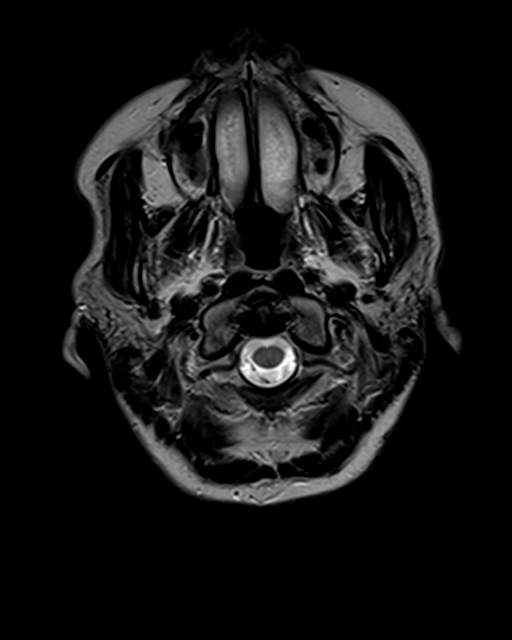
[im 12/24]
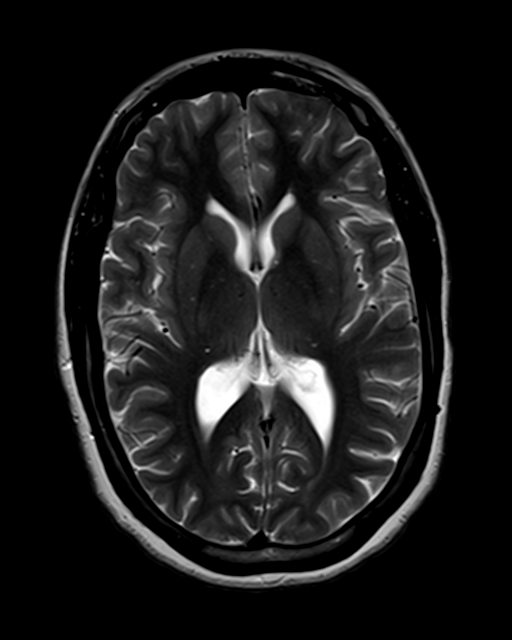
[im 24/24]
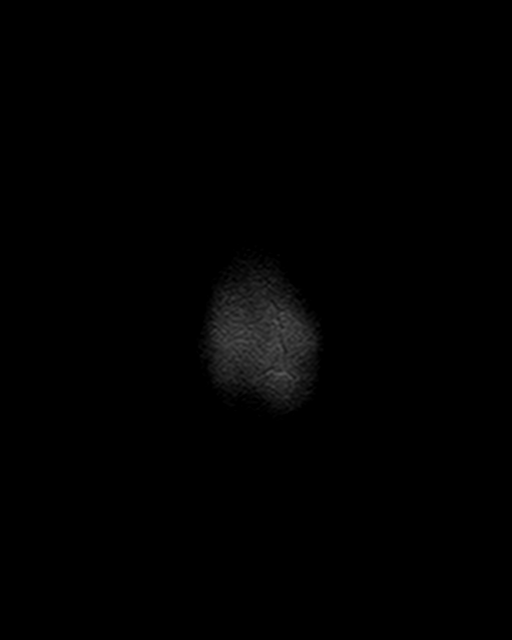

[Series 6: FLAIR · axial · 3.0mm · 0.45mm/px · z∈[-65,+88]mm · 4 of 34 slices shown]
[im 1/34]
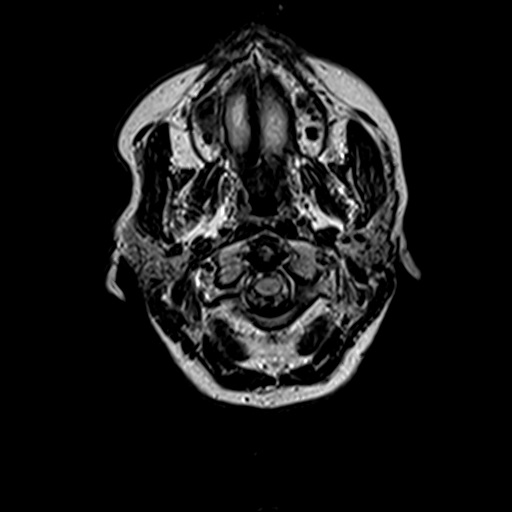
[im 12/34]
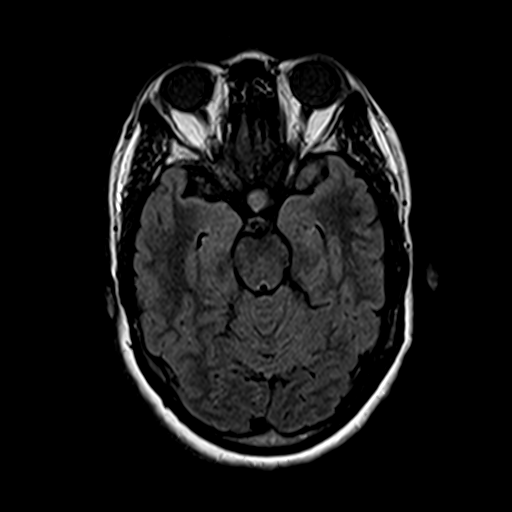
[im 23/34]
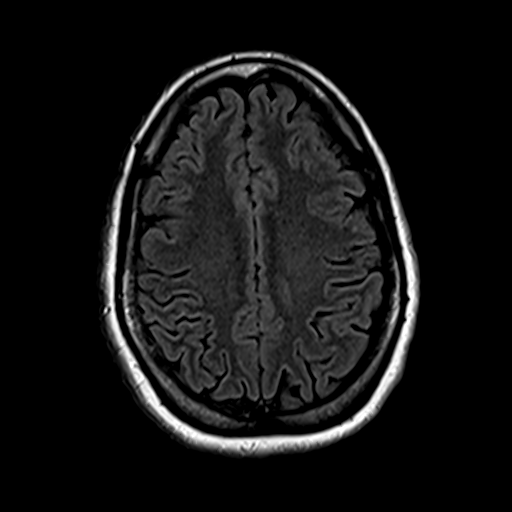
[im 34/34]
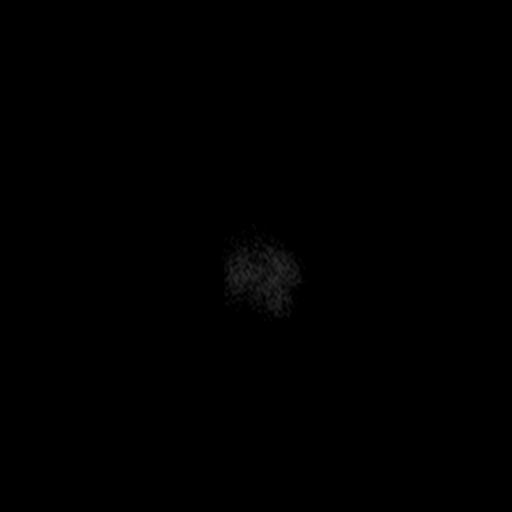

[Series 8: swi_images · axial · 4.0mm · 0.94mm/px · z∈[-59,+80]mm · 4 of 36 slices shown]
[im 1/36]
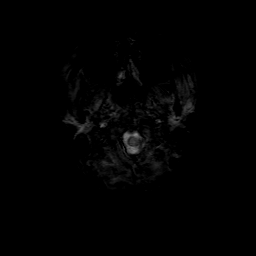
[im 12/36]
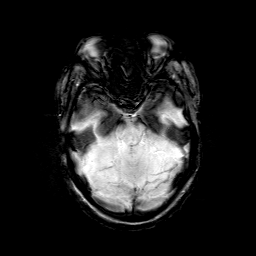
[im 24/36]
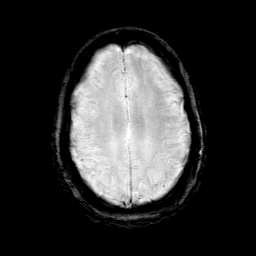
[im 36/36]
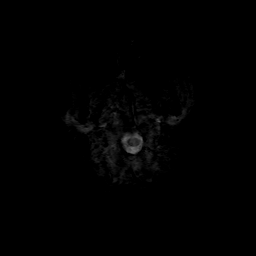

[Series 9: T1 · sagittal · 3.0mm · 0.33mm/px · 1 of 11 slices shown (2 of 3)]
[im 1/11]
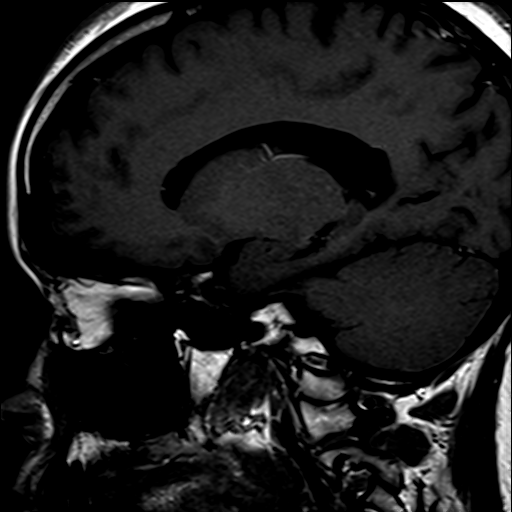

[Series 10: T1 · coronal · 3.0mm · 0.33mm/px · 1 of 11 slices shown (3 of 3)]
[im 1/11]
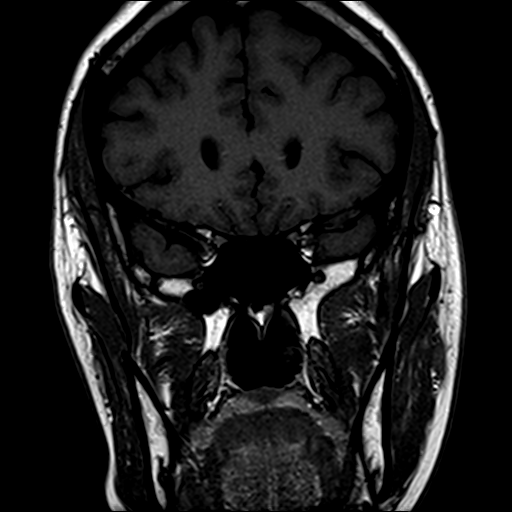

[Series 11: T2 · coronal · 3.0mm · 0.23mm/px · 1 of 11 slices shown (2 of 2)]
[im 1/11]
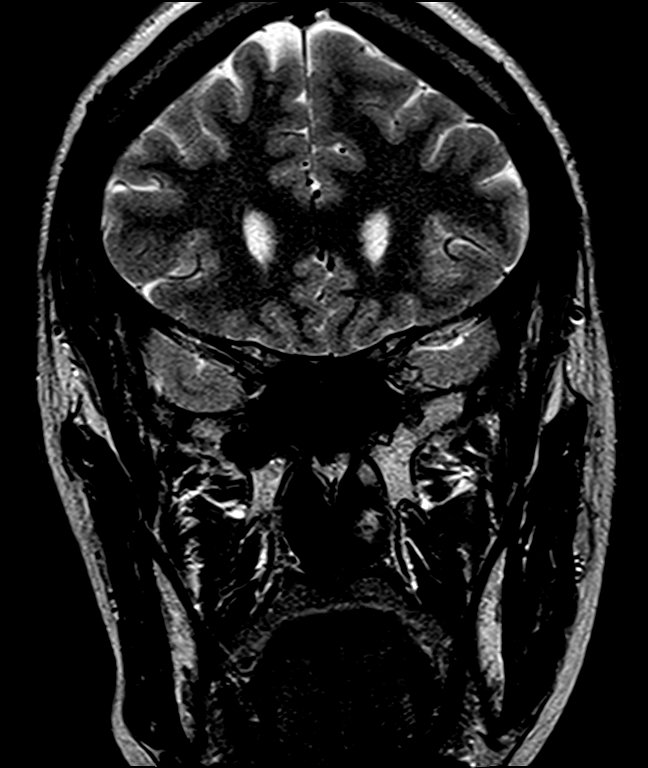

[Series 12: pre cor dynamic · coronal · non-contrast · 3.0mm · 0.35mm/px · 1 of 9 slices shown]
[im 1/9]
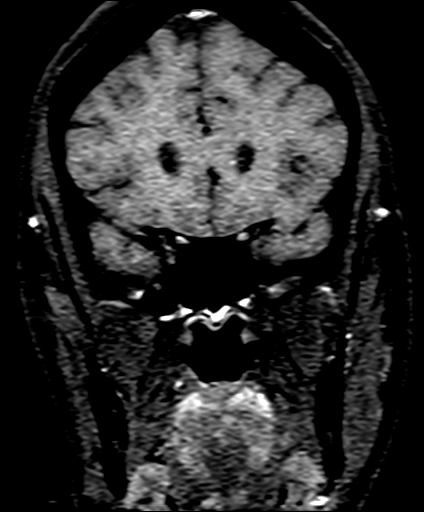

[Series 13: post fs cor · coronal · 3.0mm · 0.35mm/px · 1 of 9 slices shown]
[im 1/9]
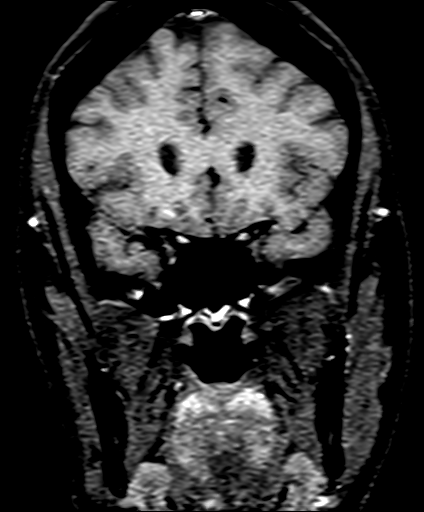

[Series 19: T1 post-contrast · sagittal · 3.0mm · 0.33mm/px · 1 of 11 slices shown (1 of 2)]
[im 1/11]
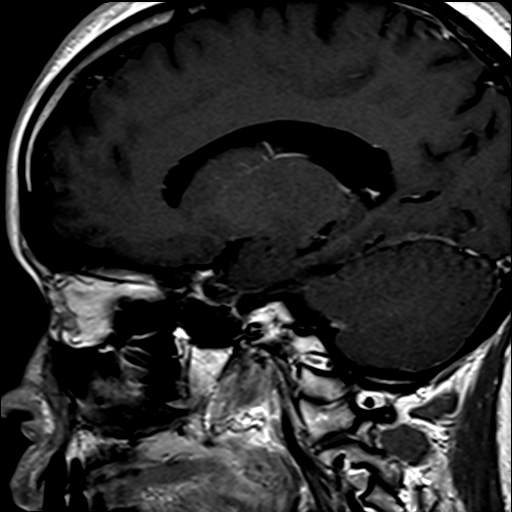

[Series 20: T1 post-contrast · coronal · 3.0mm · 0.33mm/px · 1 of 11 slices shown (2 of 2)]
[im 1/11]
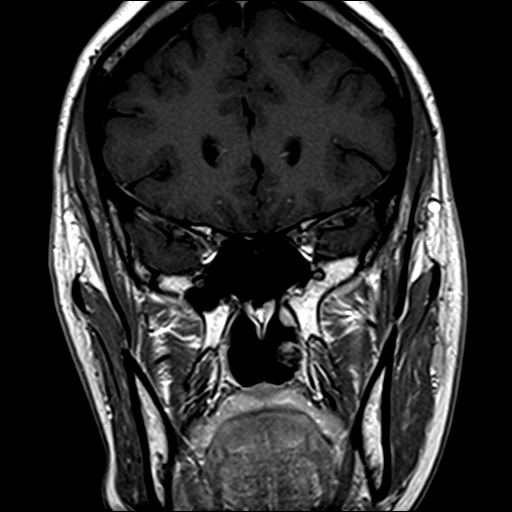

[35 of 48 positions shown; findings below may reference images not displayed]

FINDINGS: Sella: Hypoenhancing mass is again identified within the pituitary
eccentric to the left. This presently measures 1.4 x 1.2 x 1.1 cm
(previously 1.4 x 1.2 x 1 cm). As before, there is suprasellar
extension without optic chiasm compression. No evidence of cavernous
sinus invasion.

Brain: There is no acute infarction or intracranial hemorrhage.
There is no intracranial mass, mass effect, or edema. There is no
hydrocephalus or extra-axial fluid collection. Ventricles and sulci
are normal in size and configuration. No abnormal enhancement.

Vascular: Major vessel flow voids at the skull base are preserved.

Skull and upper cervical spine: Normal marrow signal is preserved.

Sinuses/Orbits: Paranasal sinuses are aerated. Orbits are
unremarkable.

Other: Mastoid air cells are clear.
IMPRESSION: Slight increase in size of pituitary macroadenoma since 04/27/2021.
No optic chiasm compression or cavernous sinus invasion.

## 2023-12-07 DIAGNOSIS — Z713 Dietary counseling and surveillance: Secondary | ICD-10-CM | POA: Diagnosis not present

## 2023-12-15 DIAGNOSIS — C73 Malignant neoplasm of thyroid gland: Secondary | ICD-10-CM | POA: Diagnosis not present

## 2023-12-15 DIAGNOSIS — D509 Iron deficiency anemia, unspecified: Secondary | ICD-10-CM | POA: Diagnosis not present

## 2023-12-15 DIAGNOSIS — D649 Anemia, unspecified: Secondary | ICD-10-CM | POA: Diagnosis not present

## 2023-12-15 DIAGNOSIS — E611 Iron deficiency: Secondary | ICD-10-CM | POA: Diagnosis not present

## 2023-12-16 DIAGNOSIS — C770 Secondary and unspecified malignant neoplasm of lymph nodes of head, face and neck: Secondary | ICD-10-CM | POA: Diagnosis not present

## 2023-12-16 DIAGNOSIS — Z85850 Personal history of malignant neoplasm of thyroid: Secondary | ICD-10-CM | POA: Diagnosis not present

## 2023-12-16 DIAGNOSIS — C73 Malignant neoplasm of thyroid gland: Secondary | ICD-10-CM | POA: Diagnosis not present

## 2023-12-18 DIAGNOSIS — D509 Iron deficiency anemia, unspecified: Secondary | ICD-10-CM | POA: Diagnosis not present

## 2023-12-18 DIAGNOSIS — E890 Postprocedural hypothyroidism: Secondary | ICD-10-CM | POA: Diagnosis not present

## 2023-12-22 DIAGNOSIS — Z713 Dietary counseling and surveillance: Secondary | ICD-10-CM | POA: Diagnosis not present

## 2023-12-28 ENCOUNTER — Ambulatory Visit: Payer: Self-pay | Admitting: Cardiology

## 2023-12-28 ENCOUNTER — Other Ambulatory Visit: Payer: Self-pay | Admitting: Neurosurgery

## 2023-12-28 DIAGNOSIS — D352 Benign neoplasm of pituitary gland: Secondary | ICD-10-CM

## 2024-01-20 ENCOUNTER — Encounter: Payer: Self-pay | Admitting: Neurosurgery

## 2024-01-23 ENCOUNTER — Ambulatory Visit
Admission: RE | Admit: 2024-01-23 | Discharge: 2024-01-23 | Disposition: A | Discharge status: Home / Self Care | Source: Ambulatory Visit | Attending: Neurosurgery | Admitting: Neurosurgery

## 2024-01-23 DIAGNOSIS — D352 Benign neoplasm of pituitary gland: Secondary | ICD-10-CM

## 2024-01-23 DIAGNOSIS — D329 Benign neoplasm of meninges, unspecified: Secondary | ICD-10-CM | POA: Diagnosis not present

## 2024-01-23 MED ORDER — GADOPICLENOL 0.5 MMOL/ML IV SOLN
7.0000 mL | Freq: Once | INTRAVENOUS | Status: AC | PRN
  Administered 2024-01-23: 7 mL via INTRAVENOUS

## 2024-01-30 DIAGNOSIS — M9905 Segmental and somatic dysfunction of pelvic region: Secondary | ICD-10-CM | POA: Diagnosis not present

## 2024-01-30 DIAGNOSIS — M9901 Segmental and somatic dysfunction of cervical region: Secondary | ICD-10-CM | POA: Diagnosis not present

## 2024-01-30 DIAGNOSIS — M9903 Segmental and somatic dysfunction of lumbar region: Secondary | ICD-10-CM | POA: Diagnosis not present

## 2024-01-30 DIAGNOSIS — M9902 Segmental and somatic dysfunction of thoracic region: Secondary | ICD-10-CM | POA: Diagnosis not present

## 2024-01-30 DIAGNOSIS — M461 Sacroiliitis, not elsewhere classified: Secondary | ICD-10-CM | POA: Diagnosis not present

## 2024-02-05 ENCOUNTER — Telehealth: Payer: Self-pay | Admitting: Cardiology

## 2024-02-05 DIAGNOSIS — H93293 Other abnormal auditory perceptions, bilateral: Secondary | ICD-10-CM | POA: Diagnosis not present

## 2024-02-05 DIAGNOSIS — H938X2 Other specified disorders of left ear: Secondary | ICD-10-CM | POA: Diagnosis not present

## 2024-02-05 DIAGNOSIS — J309 Allergic rhinitis, unspecified: Secondary | ICD-10-CM | POA: Diagnosis not present

## 2024-02-05 DIAGNOSIS — D352 Benign neoplasm of pituitary gland: Secondary | ICD-10-CM | POA: Diagnosis not present

## 2024-02-05 NOTE — Telephone Encounter (Signed)
 Patient called wanting to know if it would be okay to see a chiropractor.

## 2024-02-05 NOTE — Telephone Encounter (Signed)
 Called patient back about message. Patient stated she has seen a chiropractor once last week for neck and back pain. She thought about it afterwards about making sure she is cleared by her cardiologist to do so. Asked patient if seeing a chiropractor helped, patient stated yes, but now she is a little sore all around. Will send message to Dr. Michele for advisement. Made patient an appointment to see Dr. Michele in October for her yearly visit.

## 2024-02-09 NOTE — Telephone Encounter (Signed)
 She was referred for LBBB.  As long she is stable from cardiology standpoint since I last saw her I have no objection with her see her chiropractor.   Mindy Urbieta Higgston, DO, FACC

## 2024-02-10 NOTE — Telephone Encounter (Signed)
 Called patient back with Dr. Tyree advisement. Patient verbalized understanding.

## 2024-02-19 DIAGNOSIS — H93292 Other abnormal auditory perceptions, left ear: Secondary | ICD-10-CM | POA: Diagnosis not present

## 2024-02-22 ENCOUNTER — Encounter: Payer: Self-pay | Admitting: Cardiology

## 2024-02-22 ENCOUNTER — Ambulatory Visit: Attending: Cardiology | Admitting: Cardiology

## 2024-02-22 VITALS — BP 126/74 | HR 90 | Ht 67.0 in | Wt 152.0 lb

## 2024-02-22 DIAGNOSIS — R072 Precordial pain: Secondary | ICD-10-CM | POA: Diagnosis not present

## 2024-02-22 DIAGNOSIS — D509 Iron deficiency anemia, unspecified: Secondary | ICD-10-CM

## 2024-02-22 DIAGNOSIS — I447 Left bundle-branch block, unspecified: Secondary | ICD-10-CM

## 2024-02-22 NOTE — Patient Instructions (Signed)
 Medication Instructions:  The current medical regimen is effective;  continue present plan and medications.  *If you need a refill on your cardiac medications before your next appointment, please call your pharmacy*  Follow-Up: At [facility], you and your health needs are our priority.  As part of our continuing mission to provide you with exceptional heart care, our providers are all part of one team.  This team includes your primary Cardiologist (physician) and Advanced Practice Providers or APPs (Physician Assistants and Nurse Practitioners) who all work together to provide you with the care you need, when you need it.  Your next appointment:   Follow up as needed.

## 2024-02-22 NOTE — Progress Notes (Signed)
 Cardiology Office Note:  .   Date:  02/22/2024  ID:  Mindy [name], DOB 1981/02/19, MRN [id number] PCP:  Waylan Almarie SAUNDERS, MD  Former Cardiology Providers: [facility] in [place]   [facility] Providers Cardiologist:  Madonna Large, DO , [facility] (established care 12/29/2022) Electrophysiologist:  None  Click to update primary MD,subspecialty MD or APP then REFRESH:1}    Chief Complaint  Patient presents with   Follow-up    Precordial pain    History of Present Illness: .   Mindy [name] is a 43 y.o. African-American female whose past medical history and cardiovascular risk factors includes:  Hypertension, left bundle branch block, anemia, papillary thyroid  carcinoma, pituitary adenoma status post surgery, anxiety.   Patient was referred to the practice for evaluation of left bundle branch block. Patient was noted to have LBBB back in 2016 while she was in [place]  and at that time she had an echo and stress test.  It has been greater than 8 years since she had followed up with cardiology and therefore was referred to us  for reevaluation.  She was last seen in the office back in September 2024 and was doing well clinically.  Patient denies anginal chest pain or heart failure symptoms. However, when she is anemic secondary to low iron levels patient has noted chest pain and shortness of breath with effort related activities.  The symptoms are usually subsided after correcting her iron levels and restoration of age-appropriate hemoglobin levels.  Recently started seeing a chiropractor for back pain and after her initial encounter patient had some precordial discomfort across the anterior chest.  The pain is not brought on by effort like activities, does not resolve with resting or relaxing, self-limited.  Currently asymptomatic.  In hindsight patient thinks it is likely secondary to chiropractic interventions to her back.  She continues to do cardiovascular  workouts such as brisk walking 30 minutes a day 4 days a week as well as Zumba as time allows.  Review of Systems: .   Review of Systems  Cardiovascular:  Negative for chest pain, claudication, irregular heartbeat, leg swelling, near-syncope, orthopnea, palpitations, paroxysmal nocturnal dyspnea and syncope.  Respiratory:  Negative for shortness of breath.   Hematologic/Lymphatic: Negative for bleeding problem.    Studies Reviewed:   EKG: EKG Interpretation Date/Time:  Monday February 22 2024 09:48:03 EDT Ventricular Rate:  90 PR Interval:  186 QRS Duration:  138 QT Interval:  376 QTC Calculation: 459 R Axis:   9  Text Interpretation: Normal sinus rhythm Left bundle branch block When compared with ECG of 20-Sep-2022 18:51, No significant change was found Confirmed by Large Madonna [phone]) on 02/22/2024 10:15:19 AM  Echocardiogram: 01/07/2023:  Normal LV systolic function with visual EF 60-65%. Left ventricle cavity  is normal in size. Normal left ventricular wall thickness. Normal global  wall motion. Normal diastolic filling pattern, normal LAP.  Mild (Grade I) mitral regurgitation.  Mild tricuspid regurgitation. No evidence of pulmonary hypertension.  Mild pulmonic regurgitation.  No prior study for comparison.   RADIOLOGY: NA  Risk Assessment/Calculations:   NA   Labs:       Latest Ref Rng & Units 09/20/2022    7:49 PM 08/21/2022   10:18 AM 08/12/2022    4:02 PM  CBC  WBC 4.0 - 10.5 K/uL 6.3  9.6  5.2   Hemoglobin 12.0 - 15.0 g/dL 89.8  89.1  88.6   Hematocrit 36.0 - 46.0 % 32.9  34.7  36.1   Platelets  150 - 400 K/uL 255  245  280        Latest Ref Rng & Units 09/20/2022    7:49 PM 08/22/2022    7:32 AM 08/21/2022   10:18 AM  BMP  Glucose 70 - 99 mg/dL 882   94   BUN 6 - 20 mg/dL 9   5   Creatinine 9.55 - 1.00 mg/dL 9.28   9.32   Sodium 864 - 145 mmol/L 136  140  140   Potassium 3.5 - 5.1 mmol/L 3.8   3.7   Chloride 98 - 111 mmol/L 103   105   CO2 22 - 32 mmol/L  23   21   Calcium  8.9 - 10.3 mg/dL 8.7   8.8       Latest Ref Rng & Units 09/20/2022    7:49 PM 08/22/2022    7:32 AM 08/21/2022   10:18 AM  CMP  Glucose 70 - 99 mg/dL 882   94   BUN 6 - 20 mg/dL 9   5   Creatinine 9.55 - 1.00 mg/dL 9.28   9.32   Sodium 864 - 145 mmol/L 136  140  140   Potassium 3.5 - 5.1 mmol/L 3.8   3.7   Chloride 98 - 111 mmol/L 103   105   CO2 22 - 32 mmol/L 23   21   Calcium  8.9 - 10.3 mg/dL 8.7   8.8     No results found for: CHOL, HDL, LDLCALC, LDLDIRECT, TRIG, CHOLHDL No results for input(s): LIPOA in the last 8760 hours. No components found for: NTPROBNP No results for input(s): PROBNP in the last 8760 hours. No results for input(s): TSH in the last 8760 hours.  Physical Exam:    Today's Vitals   02/22/24 0944  BP: 126/74  Pulse: 90  SpO2: 99%  Weight: 152 lb (68.9 kg)  Height: 5' 7 (1.702 m)   Body mass index is 23.81 kg/m. Wt Readings from Last 3 Encounters:  02/22/24 152 lb (68.9 kg)  02/25/23 144 lb 12.8 oz (65.7 kg)  12/29/22 147 lb (66.7 kg)    Physical Exam  Constitutional: No distress.  hemodynamically stable  Neck: No JVD present.  Cardiovascular: Normal rate, regular rhythm, S1 normal and S2 normal. Exam reveals no gallop, no S3 and no S4.  No murmur heard. Pulmonary/Chest: Effort normal and breath sounds normal. No stridor. She has no wheezes. She has no rales.  Musculoskeletal:        General: No edema.     Cervical back: Neck supple.  Skin: Skin is warm.     Impression & Recommendation(s):  Impression:   ICD-10-CM   1. Precordial pain  R07.2 EKG 12-Lead    2. LBBB (left bundle branch block)  I44.7     3. Iron deficiency anemia, unspecified iron deficiency anemia type  D50.9        Recommendation(s):  Precordial pain Precordial discomfort is noncardiac EKG shows sinus rhythm with left bundle branch block. Has undergone appropriate cardiovascular workup as outlined above. Reassurance provided  that her recent discomfort was likely musculoskeletal after initiating chiropractor interventions No additional testing is warranted at this time. Patient is also made aware/educated that anemia can precipitate chest pain and shortness of breath.  Recommended continued treatment for her anemia as recommended by her other providers.  LBBB (left bundle branch block) Chronic and stable. No additional workup warranted  Iron deficiency anemia, unspecified iron deficiency anemia type On iron supplements  Most recent hemoglobin 13.3 g/dL as of July 2025, [facility] database   Orders Placed:  Orders Placed This Encounter  Procedures   EKG 12-Lead     Final Medication List:   No orders of the defined types were placed in this encounter.   There are no discontinued medications.   Current Outpatient Medications:    CLARITIN  10 MG tablet, Take 10 mg by mouth daily as needed for allergies or rhinitis., Disp: , Rfl:    FEROSUL 325 (65 Fe) MG tablet, Take 325 mg by mouth 2 (two) times daily., Disp: , Rfl:    levothyroxine  (SYNTHROID ) 100 MCG tablet, Take 100 mcg by mouth daily., Disp: , Rfl:    OVER THE COUNTER MEDICATION, Nature made vitamin D3 taking daily, Disp: , Rfl:    Prenatal Vit-Fe Fumarate-FA (PRENATAL VITAMINS PO), Take by mouth. Per patient taking daily, Disp: , Rfl:    OCEAN NASAL SPRAY 0.65 % nasal spray, Place 1 spray into the nose as needed for congestion. (Patient not taking: Reported on 02/22/2024), Disp: , Rfl:   Consent:   NA  Disposition:   As needed  Her questions and concerns were addressed to her satisfaction. She voices understanding of the recommendations provided during this encounter.    Signed, Madonna Michele HAS, [facility]  A Division of [facility] [address] [place], [place] [address]  02/22/2024 1:04 PM

## 2024-03-15 DIAGNOSIS — Z0000 Encounter for general adult medical examination without abnormal findings: Secondary | ICD-10-CM | POA: Diagnosis not present

## 2024-03-15 DIAGNOSIS — Z13220 Encounter for screening for lipoid disorders: Secondary | ICD-10-CM | POA: Diagnosis not present

## 2024-03-19 DIAGNOSIS — M9905 Segmental and somatic dysfunction of pelvic region: Secondary | ICD-10-CM | POA: Diagnosis not present

## 2024-03-19 DIAGNOSIS — M461 Sacroiliitis, not elsewhere classified: Secondary | ICD-10-CM | POA: Diagnosis not present

## 2024-03-19 DIAGNOSIS — M9901 Segmental and somatic dysfunction of cervical region: Secondary | ICD-10-CM | POA: Diagnosis not present

## 2024-03-19 DIAGNOSIS — M9902 Segmental and somatic dysfunction of thoracic region: Secondary | ICD-10-CM | POA: Diagnosis not present

## 2024-03-19 DIAGNOSIS — M9903 Segmental and somatic dysfunction of lumbar region: Secondary | ICD-10-CM | POA: Diagnosis not present

## 2024-03-22 DIAGNOSIS — Z0000 Encounter for general adult medical examination without abnormal findings: Secondary | ICD-10-CM | POA: Diagnosis not present

## 2024-03-22 DIAGNOSIS — N3000 Acute cystitis without hematuria: Secondary | ICD-10-CM | POA: Diagnosis not present

## 2024-03-28 DIAGNOSIS — Z9889 Other specified postprocedural states: Secondary | ICD-10-CM | POA: Diagnosis not present

## 2024-03-28 DIAGNOSIS — D352 Benign neoplasm of pituitary gland: Secondary | ICD-10-CM | POA: Diagnosis not present

## 2024-03-28 DIAGNOSIS — E890 Postprocedural hypothyroidism: Secondary | ICD-10-CM | POA: Diagnosis not present

## 2024-03-28 DIAGNOSIS — C770 Secondary and unspecified malignant neoplasm of lymph nodes of head, face and neck: Secondary | ICD-10-CM | POA: Diagnosis not present

## 2024-03-28 DIAGNOSIS — C73 Malignant neoplasm of thyroid gland: Secondary | ICD-10-CM | POA: Diagnosis not present

## 2024-03-28 DIAGNOSIS — E221 Hyperprolactinemia: Secondary | ICD-10-CM | POA: Diagnosis not present

## 2024-04-20 ENCOUNTER — Other Ambulatory Visit: Payer: Self-pay | Admitting: Family Medicine

## 2024-04-20 DIAGNOSIS — Z1231 Encounter for screening mammogram for malignant neoplasm of breast: Secondary | ICD-10-CM

## 2024-04-28 DIAGNOSIS — H5213 Myopia, bilateral: Secondary | ICD-10-CM | POA: Diagnosis not present

## 2024-04-28 DIAGNOSIS — D352 Benign neoplasm of pituitary gland: Secondary | ICD-10-CM | POA: Diagnosis not present

## 2024-05-13 ENCOUNTER — Ambulatory Visit
Admission: RE | Admit: 2024-05-13 | Discharge: 2024-05-13 | Disposition: A | Discharge status: Home / Self Care | Source: Ambulatory Visit | Attending: Family Medicine | Admitting: Family Medicine

## 2024-05-13 DIAGNOSIS — Z1231 Encounter for screening mammogram for malignant neoplasm of breast: Secondary | ICD-10-CM
